# Patient Record
Sex: Male | Born: 1985 | Race: White | Hispanic: No | Marital: Married | Smoking: Never smoker
Health system: Southern US, Community
[De-identification: ages and names within clinical notes are randomized; demographics above are authoritative.]

## PROBLEM LIST (undated history)

## (undated) HISTORY — PX: EYE MUSCLE SURGERY: SHX370

---

## 2005-01-03 ENCOUNTER — Emergency Department: Payer: Self-pay | Admitting: Emergency Medicine

## 2005-01-11 ENCOUNTER — Emergency Department: Payer: Self-pay | Admitting: Emergency Medicine

## 2008-06-16 ENCOUNTER — Ambulatory Visit: Payer: Self-pay | Admitting: Internal Medicine

## 2008-08-29 ENCOUNTER — Emergency Department: Payer: Self-pay | Admitting: Emergency Medicine

## 2012-09-06 ENCOUNTER — Emergency Department: Payer: Self-pay | Admitting: Emergency Medicine

## 2013-11-28 ENCOUNTER — Ambulatory Visit (INDEPENDENT_AMBULATORY_CARE_PROVIDER_SITE_OTHER): Payer: BC Managed Care – PPO

## 2013-11-28 ENCOUNTER — Ambulatory Visit (INDEPENDENT_AMBULATORY_CARE_PROVIDER_SITE_OTHER): Payer: BC Managed Care – PPO | Admitting: Podiatry

## 2013-11-28 ENCOUNTER — Encounter: Payer: Self-pay | Admitting: Podiatry

## 2013-11-28 VITALS — BP 116/80 | HR 77 | Resp 16 | Ht 71.0 in | Wt 155.0 lb

## 2013-11-28 DIAGNOSIS — H547 Unspecified visual loss: Secondary | ICD-10-CM | POA: Insufficient documentation

## 2013-11-28 DIAGNOSIS — M722 Plantar fascial fibromatosis: Secondary | ICD-10-CM

## 2013-11-28 DIAGNOSIS — J309 Allergic rhinitis, unspecified: Secondary | ICD-10-CM | POA: Insufficient documentation

## 2013-11-28 DIAGNOSIS — M7660 Achilles tendinitis, unspecified leg: Secondary | ICD-10-CM

## 2013-11-28 NOTE — Patient Instructions (Signed)

## 2013-11-28 NOTE — Progress Notes (Signed)
   Subjective:    Patient ID: Michael BeaverMatthew Nixon, male    DOB: [birth year], 28 y.o.   MRN: [id number]  HPI Comments: Bilateral heel pain for about 6 months now . Sharp pain in the back and bottom of the heels.  i am a runner in which it has stopped me from doing   Foot Pain      Review of Systems  All other systems reviewed and are negative.      Objective:   Physical Exam        Assessment & Plan:

## 2013-11-29 NOTE — Progress Notes (Signed)
Subjective:     Patient ID: Michael BeaverMatthew Nixon, male   DOB: 14-Mar-1985, 28 y.o.   MRN: [id number]  Foot Pain   patient presents stating I have had around 6 months of heel pain that is really bad when I try to run which I do love to do. I believe the pain is more in the back than it is on the bottom   Review of Systems  All other systems reviewed and are negative.      Objective:   Physical Exam  Nursing note and vitals reviewed. Constitutional: He is oriented to person, place, and time.  Cardiovascular: Intact distal pulses.   Musculoskeletal: Normal range of motion.  Neurological: He is oriented to person, place, and time.  Skin: Skin is warm.   neurovascular status intact with muscle strength adequate and range of motion subtalar midtarsal joint within normal limits. Patient is noted to have moderate discomfort in the posterior lateral aspect of the heel at the insertion of the Achilles tendon with mild redness and inflammation noted. Mild plantar pain but not as significant     Assessment:     Probable Achilles tendinitis with tight heel cord noted and mild plantar fasciitis    Plan:     H&P and x-rays reviewed. 8 patient instructions on anti-inflammatories physical therapy ice therapy and scanned for custom orthotics to try to lift the posterior foot but explained that this may not be everything we will need to do to get this better. Patient will be seen back when orthotics are ready

## 2013-12-06 ENCOUNTER — Telehealth: Payer: Self-pay

## 2013-12-06 NOTE — Telephone Encounter (Signed)
Left voicemail for patient to schedule appointment for orthotic pickup

## 2013-12-06 NOTE — Telephone Encounter (Signed)
Left voicemail for patient to schedule appointment for orthotic pickup 

## 2013-12-15 ENCOUNTER — Ambulatory Visit (INDEPENDENT_AMBULATORY_CARE_PROVIDER_SITE_OTHER): Payer: BC Managed Care – PPO | Admitting: *Deleted

## 2013-12-15 VITALS — BP 116/80 | HR 77 | Resp 16

## 2013-12-15 DIAGNOSIS — M722 Plantar fascial fibromatosis: Secondary | ICD-10-CM

## 2013-12-15 NOTE — Progress Notes (Signed)
Pt presents to pick up orthotics. Went over wearing instructions with pt. 

## 2013-12-15 NOTE — Patient Instructions (Signed)

## 2014-01-12 ENCOUNTER — Ambulatory Visit: Payer: BC Managed Care – PPO | Admitting: Podiatry

## 2014-01-19 ENCOUNTER — Ambulatory Visit: Payer: BC Managed Care – PPO | Admitting: Podiatry

## 2014-01-23 DIAGNOSIS — M722 Plantar fascial fibromatosis: Secondary | ICD-10-CM

## 2017-03-14 DIAGNOSIS — M791 Myalgia, unspecified site: Secondary | ICD-10-CM | POA: Diagnosis not present

## 2017-03-14 DIAGNOSIS — J111 Influenza due to unidentified influenza virus with other respiratory manifestations: Secondary | ICD-10-CM | POA: Diagnosis not present

## 2017-03-14 DIAGNOSIS — J029 Acute pharyngitis, unspecified: Secondary | ICD-10-CM | POA: Diagnosis not present

## 2017-09-18 ENCOUNTER — Encounter: Payer: Self-pay | Admitting: Emergency Medicine

## 2017-09-18 ENCOUNTER — Other Ambulatory Visit: Payer: Self-pay

## 2017-09-18 ENCOUNTER — Emergency Department
Admission: EM | Admit: 2017-09-18 | Discharge: 2017-09-18 | Disposition: A | Discharge status: Home / Self Care | Payer: 59 | Attending: Emergency Medicine | Admitting: Emergency Medicine

## 2017-09-18 ENCOUNTER — Emergency Department: Payer: 59

## 2017-09-18 DIAGNOSIS — R079 Chest pain, unspecified: Secondary | ICD-10-CM | POA: Diagnosis not present

## 2017-09-18 DIAGNOSIS — R911 Solitary pulmonary nodule: Secondary | ICD-10-CM | POA: Insufficient documentation

## 2017-09-18 DIAGNOSIS — Z79899 Other long term (current) drug therapy: Secondary | ICD-10-CM | POA: Diagnosis not present

## 2017-09-18 DIAGNOSIS — R0789 Other chest pain: Secondary | ICD-10-CM | POA: Diagnosis not present

## 2017-09-18 LAB — CBC
HEMATOCRIT: 43.5 % (ref 40.0–52.0)
Hemoglobin: 15.5 g/dL (ref 13.0–18.0)
MCH: 32.7 pg (ref 26.0–34.0)
MCHC: 35.6 g/dL (ref 32.0–36.0)
MCV: 91.8 fL (ref 80.0–100.0)
PLATELETS: 186 10*3/uL (ref 150–440)
RBC: 4.74 MIL/uL (ref 4.40–5.90)
RDW: 12.1 % (ref 11.5–14.5)
WBC: 5.5 10*3/uL (ref 3.8–10.6)

## 2017-09-18 LAB — COMPREHENSIVE METABOLIC PANEL
ALT: 23 U/L (ref 0–44)
AST: 24 U/L (ref 15–41)
Albumin: 4.3 g/dL (ref 3.5–5.0)
Alkaline Phosphatase: 64 U/L (ref 38–126)
Anion gap: 8 (ref 5–15)
BUN: 15 mg/dL (ref 6–20)
CHLORIDE: 104 mmol/L (ref 98–111)
CO2: 27 mmol/L (ref 22–32)
CREATININE: 1 mg/dL (ref 0.61–1.24)
Calcium: 9.1 mg/dL (ref 8.9–10.3)
Glucose, Bld: 160 mg/dL — ABNORMAL HIGH (ref 70–99)
POTASSIUM: 3.9 mmol/L (ref 3.5–5.1)
SODIUM: 139 mmol/L (ref 135–145)
Total Bilirubin: 1 mg/dL (ref 0.3–1.2)
Total Protein: 7.1 g/dL (ref 6.5–8.1)

## 2017-09-18 LAB — TROPONIN I: Troponin I: <0.03 ng/mL (ref <0.03)

## 2017-09-18 MED ORDER — PANTOPRAZOLE SODIUM 20 MG PO TBEC: 20.0000 mg | DELAYED_RELEASE_TABLET | Freq: Every day | ORAL | 1 refills | Status: DC

## 2017-09-18 NOTE — ED Notes (Signed)
Pt reports tightness in chest that radiates into left breast - the pain in breast is a sharp pain - the pain started one month ago and has increased in intensity since then - the pain only last for "seconds at a time" - denies shortness of breath - denies N/V

## 2017-09-18 NOTE — ED Triage Notes (Signed)
Intermittent L chest pain x 4 weeks. Denies injury. Occasional cough. Does not increase with palpitation or inspiration.

## 2017-09-18 NOTE — Discharge Instructions (Addendum)
You were seen for chest pain. Your workup today was reassuring. As I explained to you that does not mean that you do not have heart disease. You may need further evaluation to ensure you do not have a serious heart problem. Therefore it is imperative that you follow up with your doctor in 1-2 days for further evaluation.   Your chest Xray showed a pulmonary nodule. You need a repeat XR in 6 months to make sure this is not changing as enlarging nodules could be cancer.   When should you call for help?  Call 911 if: You passed out (lost consciousness) or if you feel dizzy. You have difficulty breathing. You have symptoms of a heart attack. These may include: Chest pain or pressure, or a strange feeling in your chest. Indigestion. Sweating. Shortness of breath. Nausea or vomiting. Pain, pressure, or a strange feeling in your back, neck, jaw, or upper belly or in one or both shoulders or arms. Lightheadedness or sudden weakness. A fast or irregular heartbeat. After you call 911, the operator may tell you to chew 1 adult-strength or 2 to 4 low-dose aspirin. Wait for an ambulance. Do not try to drive yourself.   Call your doctor today if: You have any trouble breathing. Your chest pain gets worse. You are dizzy or lightheaded, or you feel like you may faint. You are not getting better as expected. You are having new or different chest pain  How can you care for yourself at home? Rest until you feel better. Take your medicine exactly as prescribed. Call your doctor if you think you are having a problem with your medicine. Do not drive after taking a prescription pain medicine.

## 2017-09-18 NOTE — ED Provider Notes (Signed)
[facility] Emergency Department Provider Note  ____________________________________________  Time seen: Approximately 3:37 PM  I have reviewed the triage vital signs and the nursing notes.   HISTORY  Chief Complaint Chest Pain   HPI Michael Nixon is a 32 y.o. male with history of allergies who presents for evaluation of chest pain.  Patient reports that he has been having intermittent chest pain for a month.  He describes the pain as a tightness located diffusely across his chest with intermittent sharp component in the left upper chest and left upper quadrant.  These episodes happen several times a day and usually lasts a second or 2.  Patient reports that when he takes a deep breath the pain goes away.  No shortness of breath, no dizziness, no nausea, no vomiting, no diaphoresis.  Patient denies any history of smoking, no drug use.  He does drink every day 2-3 drinks.  He has family history of heart attack in his grandfather at the age of 67.  Patient reports that over the last week he has been having several episodes a day.  These episodes happen both at rest and with exertion.  They are not worse postprandially.  No personal or family history of blood clots, recent travel immobilization, leg pain or swelling, hemoptysis, or exogenous hormones.   Patient Active Problem List   Diagnosis Date Noted  . Allergic rhinitis 11/28/2013  . Impaired vision 11/28/2013    Past Surgical History:  Procedure Laterality Date  . EYE MUSCLE SURGERY      Prior to Admission medications   Medication Sig Start Date End Date Taking? Authorizing Provider  cetirizine (ZYRTEC) 10 MG tablet Take 10 mg by mouth daily as needed for allergies.   Yes [provider]  pantoprazole (PROTONIX) 20 MG tablet Take 1 tablet (20 mg total) by mouth daily. 09/18/17 09/18/18  Nita Sickle, MD    Allergies Aspirin  No family history on file.  Social History Social History     Tobacco Use  . Smoking status: Never Smoker  . Smokeless tobacco: Never Used  Substance Use Topics  . Alcohol use: Not on file  . Drug use: Not on file    Review of Systems  Constitutional: Negative for fever. Eyes: Negative for visual changes. ENT: Negative for sore throat. Neck: No neck pain  Cardiovascular: + chest pain. Respiratory: Negative for shortness of breath. Gastrointestinal: Negative for abdominal pain, vomiting or diarrhea. Genitourinary: Negative for dysuria. Musculoskeletal: Negative for back pain. Skin: Negative for rash. Neurological: Negative for headaches, weakness or numbness. Psych: No SI or HI  ____________________________________________   PHYSICAL EXAM:  VITAL SIGNS: ED Triage Vitals  Enc Vitals Group     BP 09/18/17 1233 (!) 142/76     Pulse Rate 09/18/17 1233 80     Resp 09/18/17 1233 18     Temp 09/18/17 1233 98.6 F (37 C)     Temp Source 09/18/17 1233 Oral     SpO2 09/18/17 1233 97 %     Weight 09/18/17 1234 170 lb (77.1 kg)     Height 09/18/17 1234 5\' 10"  (1.778 m)     Head Circumference --      Peak Flow --      Pain Score 09/18/17 1234 2     Pain Loc --      Pain Edu? --      Excl. in GC? --     Constitutional: Alert and oriented. Well appearing and in  no apparent distress. HEENT:      Head: Normocephalic and atraumatic.         Eyes: Conjunctivae are normal. Sclera is non-icteric.       Mouth/Throat: Mucous membranes are moist.       Neck: Supple with no signs of meningismus. Cardiovascular: Regular rate and rhythm. No murmurs, gallops, or rubs. 2+ symmetrical distal pulses are present in all extremities. No JVD. Respiratory: Normal respiratory effort. Lungs are clear to auscultation bilaterally. No wheezes, crackles, or rhonchi.  Gastrointestinal: Soft, non tender, and non distended with positive bowel sounds. No rebound or guarding. Musculoskeletal: Nontender with normal range of motion in all extremities. No edema,  cyanosis, or erythema of extremities. Neurologic: Normal speech and language. Face is symmetric. Moving all extremities. No gross focal neurologic deficits are appreciated. Skin: Skin is warm, dry and intact. No rash noted. Psychiatric: Mood and affect are normal. Speech and behavior are normal.  ____________________________________________   LABS (all labs ordered are listed, but only abnormal results are displayed)  Labs Reviewed  COMPREHENSIVE METABOLIC PANEL - Abnormal; Notable for the following components:      Result Value   Glucose, Bld 160 (*)    All other components within normal limits  CBC  TROPONIN I  TROPONIN I   ____________________________________________  EKG  ED ECG REPORT I, Nita Sicklearolina Amenah Tucci, the attending physician, personally viewed and interpreted this ECG.  Normal sinus rhythm, rate of 83, normal intervals, normal axis, no ST elevations or depressions.  No prior for comparison. ____________________________________________  RADIOLOGY  I have personally reviewed the images performed during this visit and I agree with the Radiologist's read.   Interpretation by Radiologist:  Dg Chest 2 View  Result Date: 09/18/2017 CLINICAL DATA:  Intermittent left-sided chest pain for 1 month. Constant chest pressure. EXAM: CHEST - 2 VIEW COMPARISON:  None. FINDINGS: A 3 mm nodular density projects in the right upper lobe. This is not well seen on the lateral view. This likely represents a small granuloma. Neoplasm would be unlikely in this age group. Heart size is normal. The lungs are otherwise clear. No significant edema or effusion is present. No airspace disease is present. The visualized soft tissues and bony thorax are otherwise unremarkable. IMPRESSION: 1. No acute cardiopulmonary disease. 2. 3 mm nodular density in the right upper lobe is likely post infectious or inflammation. Recommend follow-up two-view chest x-ray at 6-9 months versus CT of the chest with  contrast for further evaluation. Electronically Signed   By: Marin Robertshristopher  Mattern M.D.   On: 09/18/2017 13:03     ____________________________________________   PROCEDURES  Procedure(s) performed: None Procedures Critical Care performed:  None ____________________________________________   INITIAL IMPRESSION / ASSESSMENT AND PLAN / ED COURSE  32 y.o. male with history of allergies who presents for evaluation of chest pain.   Chest pain in a 32 y.o. male with low suspicion for cardiac (HEART score 1) or other serious etiology (including aortic dissection, pneumonia, pneumothorax, or pulmonary embolism) based his history and physical exam in the ED today. EKG normal. Plan for labs including CBC, chemistries and troponin now and in 3 hours, CXR and re-evaluation for disposition. Will observe patient on cardiac monitor while in the ED.      _________________________ 4:32 PM on 09/18/2017 -----------------------------------------  Second troponin is negative.  Chest x-ray showing pulmonary nodule.  Discussed this finding with the patient and recommended repeat imaging in 6 months to rule out malignancy.  At this time with normal  labs, normal EKG, and very atypical history I believe patient is safe for discharge home with close follow-up with primary care doctor.  Discussed return precautions for new or worsening chest pain, dizziness, shortness of breath, or syncope.   As part of my medical decision making, I reviewed the following data within the electronic MEDICAL RECORD NUMBER Nursing notes reviewed and incorporated, Labs reviewed , EKG interpreted , Old chart reviewed, Radiograph reviewed , Notes from prior ED visits and [place] Controlled Substance Database    Pertinent labs & imaging results that were available during my care of the patient were reviewed by me and considered in my medical decision making (see chart for details).    ____________________________________________   FINAL  CLINICAL IMPRESSION(S) / ED DIAGNOSES  Final diagnoses:  Chest pain, unspecified type  Pulmonary nodule      NEW MEDICATIONS STARTED DURING THIS VISIT:  ED Discharge Orders         Ordered    pantoprazole (PROTONIX) 20 MG tablet  Daily     09/18/17 1630           Note:  This document was prepared using Dragon voice recognition software and may include unintentional dictation errors.    Nita Sickle, MD 09/18/17 [phone]

## 2017-09-18 NOTE — ED Notes (Signed)
Dr. Veronese in room to assess patient.  Will continue to monitor.  

## 2017-09-24 DIAGNOSIS — H43812 Vitreous degeneration, left eye: Secondary | ICD-10-CM | POA: Diagnosis not present

## 2017-09-24 DIAGNOSIS — Z961 Presence of intraocular lens: Secondary | ICD-10-CM | POA: Diagnosis not present

## 2017-09-24 DIAGNOSIS — H33051 Total retinal detachment, right eye: Secondary | ICD-10-CM | POA: Diagnosis not present

## 2018-06-23 DIAGNOSIS — Z20828 Contact with and (suspected) exposure to other viral communicable diseases: Secondary | ICD-10-CM | POA: Diagnosis not present

## 2018-06-23 DIAGNOSIS — Z03818 Encounter for observation for suspected exposure to other biological agents ruled out: Secondary | ICD-10-CM | POA: Diagnosis not present

## 2018-07-25 DIAGNOSIS — Z3009 Encounter for other general counseling and advice on contraception: Secondary | ICD-10-CM | POA: Diagnosis not present

## 2018-07-25 DIAGNOSIS — H43812 Vitreous degeneration, left eye: Secondary | ICD-10-CM | POA: Diagnosis not present

## 2018-07-25 DIAGNOSIS — H33051 Total retinal detachment, right eye: Secondary | ICD-10-CM | POA: Diagnosis not present

## 2018-08-03 DIAGNOSIS — Z302 Encounter for sterilization: Secondary | ICD-10-CM | POA: Diagnosis not present

## 2018-08-10 ENCOUNTER — Encounter: Payer: Self-pay | Admitting: Emergency Medicine

## 2018-08-10 ENCOUNTER — Other Ambulatory Visit: Payer: Self-pay

## 2018-08-10 DIAGNOSIS — Z20828 Contact with and (suspected) exposure to other viral communicable diseases: Secondary | ICD-10-CM | POA: Insufficient documentation

## 2018-08-10 DIAGNOSIS — R45851 Suicidal ideations: Secondary | ICD-10-CM | POA: Insufficient documentation

## 2018-08-10 DIAGNOSIS — F99 Mental disorder, not otherwise specified: Secondary | ICD-10-CM | POA: Diagnosis present

## 2018-08-10 DIAGNOSIS — Z79899 Other long term (current) drug therapy: Secondary | ICD-10-CM | POA: Insufficient documentation

## 2018-08-10 DIAGNOSIS — F1020 Alcohol dependence, uncomplicated: Secondary | ICD-10-CM | POA: Diagnosis not present

## 2018-08-10 DIAGNOSIS — R69 Illness, unspecified: Secondary | ICD-10-CM | POA: Diagnosis not present

## 2018-08-10 DIAGNOSIS — Z03818 Encounter for observation for suspected exposure to other biological agents ruled out: Secondary | ICD-10-CM | POA: Diagnosis not present

## 2018-08-10 LAB — COMPREHENSIVE METABOLIC PANEL WITH GFR
ALT: 31 U/L (ref 0–44)
AST: 26 U/L (ref 15–41)
Albumin: 4.6 g/dL (ref 3.5–5.0)
Alkaline Phosphatase: 67 U/L (ref 38–126)
Anion gap: 10 (ref 5–15)
BUN: 23 mg/dL — ABNORMAL HIGH (ref 6–20)
CO2: 22 mmol/L (ref 22–32)
Calcium: 8.8 mg/dL — ABNORMAL LOW (ref 8.9–10.3)
Chloride: 107 mmol/L (ref 98–111)
Creatinine, Ser: 1.04 mg/dL (ref 0.61–1.24)
GFR calc Af Amer: >60 mL/min
GFR calc non Af Amer: >60 mL/min
Glucose, Bld: 111 mg/dL — ABNORMAL HIGH (ref 70–99)
Potassium: 3.6 mmol/L (ref 3.5–5.1)
Sodium: 139 mmol/L (ref 135–145)
Total Bilirubin: 0.6 mg/dL (ref 0.3–1.2)
Total Protein: 8.2 g/dL — ABNORMAL HIGH (ref 6.5–8.1)

## 2018-08-10 LAB — CBC
HCT: 44.9 % (ref 39.0–52.0)
Hemoglobin: 16.1 g/dL (ref 13.0–17.0)
MCH: 31.6 pg (ref 26.0–34.0)
MCHC: 35.9 g/dL (ref 30.0–36.0)
MCV: 88.2 fL (ref 80.0–100.0)
Platelets: 228 10*3/uL (ref 150–400)
RBC: 5.09 MIL/uL (ref 4.22–5.81)
RDW: 11.3 % — ABNORMAL LOW (ref 11.5–15.5)
WBC: 6.5 10*3/uL (ref 4.0–10.5)
nRBC: 0 % (ref 0.0–0.2)

## 2018-08-10 LAB — URINE DRUG SCREEN, QUALITATIVE (ARMC ONLY)
Amphetamines, Ur Screen: NOT DETECTED
Barbiturates, Ur Screen: NOT DETECTED
Benzodiazepine, Ur Scrn: NOT DETECTED
Cannabinoid 50 Ng, Ur ~~LOC~~: NOT DETECTED
Cocaine Metabolite,Ur ~~LOC~~: NOT DETECTED
MDMA (Ecstasy)Ur Screen: NOT DETECTED
Methadone Scn, Ur: NOT DETECTED
Opiate, Ur Screen: NOT DETECTED
Phencyclidine (PCP) Ur S: NOT DETECTED
Tricyclic, Ur Screen: NOT DETECTED

## 2018-08-10 LAB — ETHANOL: Alcohol, Ethyl (B): 232 mg/dL — ABNORMAL HIGH (ref <10)

## 2018-08-10 LAB — ACETAMINOPHEN LEVEL: Acetaminophen (Tylenol), Serum: <10 ug/mL — ABNORMAL LOW (ref 10–30)

## 2018-08-10 LAB — SALICYLATE LEVEL: Salicylate Lvl: <7 mg/dL (ref 2.8–30.0)

## 2018-08-10 NOTE — ED Triage Notes (Addendum)
Patient coming in voluntarily with [facility] because wife called and said that they had an argument and "stated he wanted to end it all and that he was going to get into the gun safe" Patient has a vasectomy 1 week ago and states he is still sore and that much of the arguing and fighting has been because of the vasectomy. Patient completely denies HI and SI and states that he and his wife were just fighting.

## 2018-08-11 ENCOUNTER — Emergency Department
Admission: EM | Admit: 2018-08-11 | Discharge: 2018-08-11 | Disposition: A | Discharge status: Other Acute Inpatient Hospital | Payer: 59 | Attending: Emergency Medicine | Admitting: Emergency Medicine

## 2018-08-11 ENCOUNTER — Inpatient Hospital Stay (HOSPITAL_COMMUNITY)
Admission: AD | Admit: 2018-08-11 | Discharge: 2018-08-15 | DRG: 885 | Disposition: A | Discharge status: Home / Self Care | Payer: 59 | Source: Intra-hospital | Attending: Psychiatry | Admitting: Psychiatry

## 2018-08-11 ENCOUNTER — Encounter (HOSPITAL_COMMUNITY): Payer: Self-pay

## 2018-08-11 DIAGNOSIS — F10920 Alcohol use, unspecified with intoxication, uncomplicated: Secondary | ICD-10-CM

## 2018-08-11 DIAGNOSIS — Z79899 Other long term (current) drug therapy: Secondary | ICD-10-CM | POA: Diagnosis not present

## 2018-08-11 DIAGNOSIS — Y907 Blood alcohol level of 200-239 mg/100 ml: Secondary | ICD-10-CM | POA: Diagnosis present

## 2018-08-11 DIAGNOSIS — R45850 Homicidal ideations: Secondary | ICD-10-CM | POA: Diagnosis not present

## 2018-08-11 DIAGNOSIS — F332 Major depressive disorder, recurrent severe without psychotic features: Principal | ICD-10-CM | POA: Diagnosis present

## 2018-08-11 DIAGNOSIS — F1024 Alcohol dependence with alcohol-induced mood disorder: Secondary | ICD-10-CM

## 2018-08-11 DIAGNOSIS — F1094 Alcohol use, unspecified with alcohol-induced mood disorder: Secondary | ICD-10-CM

## 2018-08-11 DIAGNOSIS — R45851 Suicidal ideations: Secondary | ICD-10-CM | POA: Diagnosis present

## 2018-08-11 DIAGNOSIS — Z20828 Contact with and (suspected) exposure to other viral communicable diseases: Secondary | ICD-10-CM | POA: Diagnosis not present

## 2018-08-11 DIAGNOSIS — R69 Illness, unspecified: Secondary | ICD-10-CM | POA: Diagnosis not present

## 2018-08-11 DIAGNOSIS — F1020 Alcohol dependence, uncomplicated: Secondary | ICD-10-CM | POA: Diagnosis not present

## 2018-08-11 LAB — SARS CORONAVIRUS 2 BY RT PCR (HOSPITAL ORDER, PERFORMED IN ~~LOC~~ HOSPITAL LAB): SARS Coronavirus 2: NEGATIVE

## 2018-08-11 MED ORDER — HYDROXYZINE HCL 25 MG PO TABS: 25.0000 mg | ORAL_TABLET | Freq: Three times a day (TID) | ORAL | Status: DC | PRN

## 2018-08-11 MED ORDER — CHLORDIAZEPOXIDE HCL 25 MG PO CAPS: 25.0000 mg | ORAL_CAPSULE | Freq: Every day | ORAL | Status: DC

## 2018-08-11 MED ORDER — THIAMINE HCL 100 MG/ML IJ SOLN: 100.0000 mg | Freq: Every day | INTRAMUSCULAR | Status: DC

## 2018-08-11 MED ORDER — VITAMIN B-1 100 MG PO TABS
100.0000 mg | ORAL_TABLET | Freq: Every day | ORAL | Status: DC
  Administered 2018-08-12 – 2018-08-15 (×4): 100 mg via ORAL
  Filled 2018-08-11 (×6): qty 1

## 2018-08-11 MED ORDER — CHLORDIAZEPOXIDE HCL 25 MG PO CAPS: 25.0000 mg | ORAL_CAPSULE | Freq: Four times a day (QID) | ORAL | Status: AC | PRN

## 2018-08-11 MED ORDER — TRAZODONE HCL 50 MG PO TABS: 50.0000 mg | ORAL_TABLET | Freq: Every evening | ORAL | Status: DC | PRN

## 2018-08-11 MED ORDER — LORAZEPAM 2 MG PO TABS: 0.0000 mg | ORAL_TABLET | Freq: Four times a day (QID) | ORAL | Status: DC

## 2018-08-11 MED ORDER — ALUM & MAG HYDROXIDE-SIMETH 200-200-20 MG/5ML PO SUSP: 30.0000 mL | ORAL | Status: DC | PRN

## 2018-08-11 MED ORDER — ACETAMINOPHEN 325 MG PO TABS: 650.0000 mg | ORAL_TABLET | Freq: Four times a day (QID) | ORAL | Status: DC | PRN

## 2018-08-11 MED ORDER — CHLORDIAZEPOXIDE HCL 25 MG PO CAPS: 25.0000 mg | ORAL_CAPSULE | Freq: Three times a day (TID) | ORAL | Status: DC

## 2018-08-11 MED ORDER — VITAMIN B-1 100 MG PO TABS: 100.0000 mg | ORAL_TABLET | Freq: Every day | ORAL | Status: DC

## 2018-08-11 MED ORDER — LORAZEPAM 2 MG/ML IJ SOLN: 0.0000 mg | Freq: Four times a day (QID) | INTRAMUSCULAR | Status: DC

## 2018-08-11 MED ORDER — CHLORDIAZEPOXIDE HCL 25 MG PO CAPS
25.0000 mg | ORAL_CAPSULE | Freq: Four times a day (QID) | ORAL | Status: DC
  Administered 2018-08-12: 25 mg via ORAL
  Filled 2018-08-11 (×2): qty 1

## 2018-08-11 MED ORDER — LORAZEPAM 2 MG/ML IJ SOLN: 0.0000 mg | Freq: Two times a day (BID) | INTRAMUSCULAR | Status: DC

## 2018-08-11 MED ORDER — ADULT MULTIVITAMIN W/MINERALS CH
1.0000 | ORAL_TABLET | Freq: Every day | ORAL | Status: DC
  Administered 2018-08-12 – 2018-08-15 (×4): 1 via ORAL
  Filled 2018-08-11 (×6): qty 1

## 2018-08-11 MED ORDER — MAGNESIUM HYDROXIDE 400 MG/5ML PO SUSP: 30.0000 mL | Freq: Every day | ORAL | Status: DC | PRN

## 2018-08-11 MED ORDER — LOPERAMIDE HCL 2 MG PO CAPS: 2.0000 mg | ORAL_CAPSULE | ORAL | Status: AC | PRN

## 2018-08-11 MED ORDER — ONDANSETRON 4 MG PO TBDP: 4.0000 mg | ORAL_TABLET | Freq: Four times a day (QID) | ORAL | Status: AC | PRN

## 2018-08-11 MED ORDER — CHLORDIAZEPOXIDE HCL 25 MG PO CAPS: 25.0000 mg | ORAL_CAPSULE | ORAL | Status: DC

## 2018-08-11 MED ORDER — LORAZEPAM 2 MG PO TABS: 0.0000 mg | ORAL_TABLET | Freq: Two times a day (BID) | ORAL | Status: DC

## 2018-08-11 NOTE — ED Provider Notes (Signed)
Vitals:   08/11/18 1700 08/11/18 1911  BP: (!) 159/90 (!) 167/77  Pulse: 93 (!) 101  Resp:  16  Temp:  99.1 F (37.3 C)  SpO2: (!) 87% 96%    Patient awake and alert.  Comfortable with plan for transfer.  Understanding and agreement with transfer plan.  He appears stable in no distress   Delman Kitten, MD 08/11/18 2020

## 2018-08-11 NOTE — BH Assessment (Signed)
Patient has been accepted to [facility].  Patient assigned to room 302 Accepting physician is Dr. Mallie Darting.  Call report to [phone].  Representative was [employer].   ER Staff is aware of it:  Solicitor, Patient's Nurse      Address: [address] [place], [place] [address]

## 2018-08-11 NOTE — ED Notes (Addendum)
Liston Alba, pt's wife, would like a phone call later this morning/today when pt's disposition has been determined (she wants to know if he's being hospitalized for psych treatment or being d/c'd). She would like to be informed d/t safety concerns for herself and children.  [phone]

## 2018-08-11 NOTE — Progress Notes (Signed)
Psychoeducational Group Note  Date:  08/11/2018 Time:  2113  Group Topic/Focus:  Wrap-Up Group:   The focus of this group is to help patients review their daily goal of treatment and discuss progress on daily workbooks.  Participation Level: Did Not Attend  Participation Quality:  Not Applicable  Affect:  Not Applicable  Cognitive:  Not Applicable  Insight:  Not Applicable  Engagement in Group: Not Applicable  Additional Comments:  The patient did not attend group this evening.   Archie Balboa S 08/11/2018, 9:13 PM

## 2018-08-11 NOTE — ED Notes (Signed)
Pt provided with plan of care update.

## 2018-08-11 NOTE — BH Assessment (Signed)
Assessment Note  Michael Nixon is a 33 y.o. male who was brought to [facility] voluntarily by police due to getting into a verbal confrontation with his wife and, according to her, raging about the house, going to his gun safe, and stating over and over that, "I'm gonna die." Pt's wife stated that pt's gun safe is in a closet inside of the garage and that each of their rooms and doors has a sensor that tells you when someone opens a door and that she heard the alarm go off when her husband opened the closet his gun safe is in, which is why she believes he went into his gun safe. Pt's wife did not note seeing her husband holding a gun at any time during this altercation, but she called the police due to concerns that her husband would harm himself. Pt's wife stated that pt told her that he wanted to blow his brains out. Pt's wife stats pt has had a "hard recovery with his vasectomy" and that "he drinks a lot," adding that pt has been drinking a high-gravity IPA that has around 9% alcohol and that he drinks around 5 of those in a 2-hour window. Pt's wife stated, "he did not hit me" but that she was scared at the possibility of pt hurting himself.  Pt denies SI, stating that he has never had any thoughts or intentions of harming himself. He states he has never attempted to harm himself nor has he ever been hospitalized for mental health reasons or seen a therapist or a psychiatrist. Pt denies HI, AVH, NSSIB, engagement with the legal system, or SA (with the exception of EtOH). Pt states he drinks approximately 6 days per week and that on weekdays he consumes "4-ish" drinks and that on weekends he consumes 5-6 drinks. Pt acknowledges he has guns but states he keeps them in a gun safe to keep them safe from the children.  Pt gave verbal consent for clinician to contact his wife for collateral information.  Pt is oriented x4. His recent and remote memory is intact. Pt was cooperative throughout the assessment process.  Pt's insight, judgement, and impulse control is impaired at this time.    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 No pertinent past medical history.  Past Surgical History:  Procedure Laterality Date  . EYE MUSCLE SURGERY      Family History: No family history on file.  Social History:  reports that he has never smoked. He has never used smokeless tobacco. No history on file for alcohol and drug.  Additional Social History:  Alcohol / Drug Use Pain Medications: Please see MAR Prescriptions: Please see MAR Over the Counter: Please see MAR History of alcohol / drug use?: Yes Longest period of sobriety (when/how long): Unknown Substance #1 Name of Substance 1: EtOH 1 - Age of First Use: Unknown 1 - Amount (size/oz): KCLEXNTZ = 4-ish beverages; Weekends = 5/6 beverages 1 - Frequency: 6 days/week 1 - Duration: Unknown 1 - Last Use / Amount: 08/10/2018  CIWA: CIWA-Ar BP: (!) 148/92 Pulse Rate: (!) 106 COWS:    Allergies:  Allergies  Allergen Reactions  . Aspirin Hives  . Bee Venom     Home Medications: (Not in a hospital admission)   OB/GYN Status:  No LMP for male patient.  General Assessment Data Location of Assessment: [facility] ED TTS Assessment: In system Is this a Tele or Face-to-Face Assessment?: Face-to-Face Is this an Initial Assessment  or a Re-assessment for this encounter?: Initial Assessment Patient Accompanied by:: N/A Language Other than English: No Living Arrangements: Other (Comment)(Pt lives with his wife and their two children) What gender do you identify as?: Male Marital status: Married ArtistMaiden name: Kosch Pregnancy Status: No Living Arrangements: Spouse/significant other, Children Can pt return to current living arrangement?: (UTA) Admission Status: Voluntary Is patient capable of signing voluntary admission?: Yes Referral Source: MD Insurance type: AETNA     Crisis Care Plan Living  Arrangements: Spouse/significant other, Children Legal Guardian: Other:(Self) Name of Psychiatrist: None Name of Therapist: None  Education Status Is patient currently in school?: No Is the patient employed, unemployed or receiving disability?: Employed  Risk to self with the past 6 months Suicidal Ideation: (Pt denies, though his wife states he made multiple threats) Has patient been a risk to self within the past 6 months prior to admission? : No Suicidal Intent: (Pt denies, though his wife states he made multiple threats) Has patient had any suicidal intent within the past 6 months prior to admission? : No Is patient at risk for suicide?: (Pt denies, though his wife states he made multiple threats) Suicidal Plan?: (Pt's wife states pt went to his gun safe & made threats) Has patient had any suicidal plan within the past 6 months prior to admission? : No Access to Means: Yes Specify Access to Suicidal Means: Pt has a gun safe with guns in it What has been your use of drugs/alcohol within the last 12 months?: Pt acknowledges almost daily EtOH use Previous Attempts/Gestures: No How many times?: 0 Other Self Harm Risks: None noted Triggers for Past Attempts: None known Intentional Self Injurious Behavior: None Family Suicide History: Unable to assess Recent stressful life event(s): Conflict (Comment)(Conflict between pt and his wife, wife possibly cheating) Persecutory voices/beliefs?: No Depression: Yes Depression Symptoms: Guilt, Feeling worthless/self pity, Feeling angry/irritable Substance abuse history and/or treatment for substance abuse?: No  Risk to Others within the past 6 months Homicidal Ideation: No Does patient have any lifetime risk of violence toward others beyond the six months prior to admission? : No Thoughts of Harm to Others: No Current Homicidal Intent: No Current Homicidal Plan: No Access to Homicidal Means: No Identified Victim: None noted History of harm  to others?: No Assessment of Violence: On admission Violent Behavior Description: None noted Does patient have access to weapons?: Yes (Comment)(Pt has a gun safe with guns in it) Criminal Charges Pending?: No Does patient have a court date: No Is patient on probation?: No  Psychosis Hallucinations: None noted Delusions: None noted  Mental Status Report Appearance/Hygiene: In scrubs Eye Contact: Good Motor Activity: Unremarkable, Other (Comment)(Pt is sitting up in his hospital bed) Speech: Logical/coherent Level of Consciousness: Alert Mood: Pleasant, Sullen Affect: Appropriate to circumstance Anxiety Level: Moderate Thought Processes: Coherent, Relevant Judgement: Partial Orientation: Person, Place, Time, Situation Obsessive Compulsive Thoughts/Behaviors: None  Cognitive Functioning Concentration: Normal Memory: Recent Intact, Remote Intact Is patient IDD: No Insight: Good Impulse Control: Poor Appetite: Good Have you had any weight changes? : No Change Sleep: No Change Total Hours of Sleep: 6 Vegetative Symptoms: None  ADLScreening [facility] Assessment Services) Patient's cognitive ability adequate to safely complete daily activities?: Yes Patient able to express need for assistance with ADLs?: Yes Independently performs ADLs?: Yes (appropriate for developmental age)  Prior Inpatient Therapy Prior Inpatient Therapy: No  Prior Outpatient Therapy Prior Outpatient Therapy: No Does patient have an ACCT team?: No Does patient have Intensive In-House Services?  :  No Does patient have [facility] services? : No Does patient have P4CC services?: No  ADL Screening (condition at time of admission) Patient's cognitive ability adequate to safely complete daily activities?: Yes Is the patient deaf or have difficulty hearing?: No Does the patient have difficulty seeing, even when wearing glasses/contacts?: No Does the patient have difficulty concentrating, remembering, or making  decisions?: No Patient able to express need for assistance with ADLs?: Yes Does the patient have difficulty dressing or bathing?: No Independently performs ADLs?: Yes (appropriate for developmental age) Does the patient have difficulty walking or climbing stairs?: No Weakness of Legs: None Weakness of Arms/Hands: None  Home Assistive Devices/Equipment Home Assistive Devices/Equipment: None  Therapy Consults (therapy consults require a physician order) PT Evaluation Needed: No OT Evalulation Needed: No SLP Evaluation Needed: No Abuse/Neglect Assessment (Assessment to be complete while patient is alone) Abuse/Neglect Assessment Can Be Completed: Unable to assess, patient is non-responsive or altered mental status Values / Beliefs Cultural Requests During Hospitalization: None Spiritual Requests During Hospitalization: None Consults Spiritual Care Consult Needed: No Social Work Consult Needed: No Merchant navy officerAdvance Directives (For Healthcare) Does Patient Have a Medical Advance Directive?: No Would patient like information on creating a medical advance directive?: No - Patient declined       Disposition: Awaiting disposition   Disposition Initial Assessment Completed for this Encounter: Yes  On Site Evaluation by:   Reviewed with Physician:    Ralph DowdySamantha L Cena Bruhn 08/11/2018 7:21 AM

## 2018-08-11 NOTE — ED Notes (Signed)
IVC/Consult ordered ?

## 2018-08-11 NOTE — ED Notes (Signed)
Pt provided with 8oz of water per pt request.

## 2018-08-11 NOTE — Tx Team (Signed)
Initial Treatment Plan 08/11/2018 10:56 PM [name] [id number]    PATIENT STRESSORS: Health problems Substance abuse   PATIENT STRENGTHS: Ability for insight Active sense of humor Average or above average intelligence Capable of independent living Communication skills Motivation for treatment/growth Physical Health Supportive family/friends   PATIENT IDENTIFIED PROBLEMS: "depression"  "worrying"  "substance abuse"                 DISCHARGE CRITERIA:  Ability to meet basic life and health needs Improved stabilization in mood, thinking, and/or behavior Medical problems require only outpatient monitoring   PRELIMINARY DISCHARGE PLAN: Attend 12-step recovery group Participate in family therapy Return to previous living arrangement  PATIENT/FAMILY INVOLVEMENT: This treatment plan has been presented to and reviewed with the patient, Michael Nixon.  The patient and family have been given the opportunity to ask questions and make suggestions.  Baron Sane, RN 08/11/2018, 10:56 PM
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
## 2018-08-11 NOTE — ED Notes (Signed)
Pt provided with a lunch tray and fluids.  

## 2018-08-11 NOTE — ED Notes (Signed)
Pt given breakfast tray at this time. 

## 2018-08-11 NOTE — ED Notes (Signed)
Pt given meal tray and drink. Pt eating at this time. 
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
## 2018-08-11 NOTE — ED Provider Notes (Signed)
[facility] Emergency Department Provider Note   First MD Initiated Contact with Patient 08/11/18 [phone]     (approximate)  I have reviewed the triage vital signs and the nursing notes.   HISTORY  Chief Complaint Post-op Problem and Mental Health Problem   HPI Marce Charlesworth is a 33 y.o. male presents to the emergency department voluntarily with [facility] department.  Sheriff's officer states that call was because his wife stated that the patient stated he wanted to end it all and to go get into the gun safe".  Patient denies this allegation.  Patient states that he has been arguing with his wife recently including tonight.  Patient denies any suicidal ideation.  Patient also denies any homicidal ideation.       History reviewed. No pertinent past medical history.  Patient Active Problem List   Diagnosis Date Noted  . Allergic rhinitis 11/28/2013  . Impaired vision 11/28/2013    Past Surgical History:  Procedure Laterality Date  . EYE MUSCLE SURGERY      Prior to Admission medications   Medication Sig Start Date End Date Taking? Authorizing Provider  cetirizine (ZYRTEC) 10 MG tablet Take 10 mg by mouth daily as needed for allergies.    [provider]  pantoprazole (PROTONIX) 20 MG tablet Take 1 tablet (20 mg total) by mouth daily. 09/18/17 09/18/18  Rudene Re, MD    Allergies Aspirin and Bee venom  No family history on file.  Social History Social History   Tobacco Use  . Smoking status: Never Smoker  . Smokeless tobacco: Never Used  Substance Use Topics  . Alcohol use: Not on file  . Drug use: Not on file    Review of Systems Constitutional: No fever/chills Eyes: No visual changes. ENT: No sore throat. Cardiovascular: Denies chest pain. Respiratory: Denies shortness of breath. Gastrointestinal: No abdominal pain.  No nausea, no vomiting.  No diarrhea.  No constipation. Genitourinary: Negative for  dysuria. Musculoskeletal: Negative for neck pain.  Negative for back pain. Integumentary: Negative for rash. Neurological: Negative for headaches, focal weakness or numbness. Psychiatric:  Ported suicidal ideation  ____________________________________________   PHYSICAL EXAM:  VITAL SIGNS: ED Triage Vitals  Enc Vitals Group     BP 08/10/18 2255 (!) 173/90     Pulse Rate 08/10/18 2255 (!) 129     Resp 08/10/18 2255 19     Temp 08/10/18 2255 99.6 F (37.6 C)     Temp Source 08/10/18 2255 Oral     SpO2 08/10/18 2255 97 %     Weight 08/10/18 2304 81.6 kg (180 lb)     Height 08/10/18 2304 1.778 m (5\' 10" )     Head Circumference --      Peak Flow --      Pain Score 08/10/18 2256 5     Pain Loc --      Pain Edu? --      Excl. in [place]? --     Constitutional: Alert and oriented. Well appearing and in no acute distress. Eyes: Conjunctivae are normal. Mouth/Throat: Mucous membranes are moist.  Oropharynx non-erythematous. Neck: No stridor. Cardiovascular: Normal rate, regular rhythm. Good peripheral circulation. Grossly normal heart sounds. Respiratory: Normal respiratory effort.  No retractions. No audible wheezing. Gastrointestinal: Soft and nontender. No distention.  Musculoskeletal: No lower extremity tenderness nor edema. No gross deformities of extremities. Neurologic:  Normal speech and language. No gross focal neurologic deficits are appreciated.  Skin:  Skin is warm,  dry and intact. No rash noted. Psychiatric: Mood and affect are normal. Speech and behavior are normal.  ____________________________________________   LABS (all labs ordered are listed, but only abnormal results are displayed)  Labs Reviewed  COMPREHENSIVE METABOLIC PANEL - Abnormal; Notable for the following components:      Result Value   Glucose, Bld 111 (*)    BUN 23 (*)    Calcium 8.8 (*)    Total Protein 8.2 (*)    All other components within normal limits  ETHANOL - Abnormal; Notable for the  following components:   Alcohol, Ethyl (B) 232 (*)    All other components within normal limits  ACETAMINOPHEN LEVEL - Abnormal; Notable for the following components:   Acetaminophen (Tylenol), Serum <10 (*)    All other components within normal limits  CBC - Abnormal; Notable for the following components:   RDW 11.3 (*)    All other components within normal limits  SALICYLATE LEVEL  URINE DRUG SCREEN, QUALITATIVE ([facility] ONLY)     Procedures   ____________________________________________   INITIAL IMPRESSION / MDM / ASSESSMENT AND PLAN / ED COURSE  As part of my medical decision making, I reviewed the following data within the electronic MEDICAL RECORD NUMBER   33 year old male presenting with above-stated history and physical exam with reported suicidal ideation.  Patient will be evaluated by TTS and psychiatry staff.  Patient is voluntary at this time however I instructed the patient that in the event he chose to leave I would have to involuntarily commit him.  Patient states that would not be necessary as he is willing to stay and speak with a psychiatrist.  ____________________________________________  FINAL CLINICAL IMPRESSION(S) / ED DIAGNOSES  Final diagnoses:  Alcoholic intoxication without complication (HCC)     MEDICATIONS GIVEN DURING THIS VISIT:  Medications - No data to display   ED Discharge Orders    None      *Please note:  Lynann BeaverMatthew Heckler was evaluated in Emergency Department on 08/11/2018 for the symptoms described in the history of present illness. He was evaluated in the context of the global COVID-19 pandemic, which necessitated consideration that the patient might be at risk for infection with the SARS-CoV-2 virus that causes COVID-19. Institutional protocols and algorithms that pertain to the evaluation of patients at risk for COVID-19 are in a state of rapid change based on information released by regulatory bodies including the CDC and federal and state  organizations. These policies and algorithms were followed during the patient's care in the ED.  Some ED evaluations and interventions may be delayed as a result of limited staffing during the pandemic.*  Note:  This document was prepared using Dragon voice recognition software and may include unintentional dictation errors.   Darci CurrentBrown, [place] N, MD 08/11/18 [phone]

## 2018-08-11 NOTE — ED Notes (Signed)
Psychiatrist at bedside at this time.  

## 2018-08-11 NOTE — ED Provider Notes (Signed)
b ----------------------------------------- 2:43 PM on 08/11/2018 -----------------------------------------  And evaluated by psychiatry, they do recommend IVC, this will be performed.   Schuyler Amor, MD 08/11/18 [phone]

## 2018-08-11 NOTE — ED Notes (Signed)
This RN provided pt's belongs to sheriff. Pt is calm and cooperative at this time.

## 2018-08-11 NOTE — BH Assessment (Signed)
Referral information for Psychiatric Hospitalization faxed to;   [facility] [phone]),   . [place] [phone] or [phone])  . [facility] [phone]),   . [place] [phone]),   . [facility] [phone]).  [facility] [phone])

## 2018-08-11 NOTE — Consult Note (Signed)
[facility]   Reason for Consult: Suicide attempt Referring Physician:  Dr.McShane Patient Identification: Michael Nixon MRN:  [id number] Principal Diagnosis: <principal problem not specified> Diagnosis:  Active Problems:   * No active hospital problems. *   Total Time spent with patient: 20 minutes  Subjective:   Michael Nixon is a 33 y.o. male patient admitted with alcohol intoxication and making suicidal statements.  HPI: Patient is a 33 year old male who was brought in last night after he got intoxicated and voiced suicidal thoughts in the presence of their 35 year old daughter.  Patient had apparently held a gun to his head and threatened suicide.  He has a heavy alcohol problem and he admits to that.  However patient denies holding a gun and threatening suicide. States that he recently had got a vasectomy and his wife was very upset about the vasectomy.  Patient is interested in leaving and denied any depressed mood or other psychiatric symptoms. He denied any psychotic symptoms.  Denies use of any other drugs. Denied any previous psychiatric care or psychiatric hospitalizations.  Collateral-collateral obtained from his wife who reported that patient had voiced suicidal thoughts in the presence of the 536 year old daughter.  She reports that they have 30 guns in the home.  She reports that patient makes suicidal statements when he is intoxicated.  Apparently the other evening he was aggressive towards her.  She reports that she is not comfortable with him returning home due to his drinking and the guns in the house.  Past Psychiatric History: none  Risk to Self: Suicidal Ideation: (Pt denies, though his wife states he made multiple threats) Suicidal Intent: (Pt denies, though his wife states he made multiple threats) Is patient at risk for suicide?: (Pt denies, though his wife states he made multiple threats) Suicidal Plan?: (Pt's wife states pt went to his  gun safe & made threats) Access to Means: Yes Specify Access to Suicidal Means: Pt has a gun safe with guns in it What has been your use of drugs/alcohol within the last 12 months?: Pt acknowledges almost daily EtOH use How many times?: 0 Other Self Harm Risks: None noted Triggers for Past Attempts: None known Intentional Self Injurious Behavior: None Risk to Others: Homicidal Ideation: No Thoughts of Harm to Others: No Current Homicidal Intent: No Current Homicidal Plan: No Access to Homicidal Means: No Identified Victim: None noted History of harm to others?: No Assessment of Violence: On admission Violent Behavior Description: None noted Does patient have access to weapons?: Yes (Comment)(Pt has a gun safe with guns in it) Criminal Charges Pending?: No Does patient have a court date: No Prior Inpatient Therapy: Prior Inpatient Therapy: No Prior Outpatient Therapy: Prior Outpatient Therapy: No Does patient have an ACCT team?: No Does patient have Intensive In-House Services?  : No Does patient have [facility] services? : No Does patient have P4CC services?: No  Past Medical History: History reviewed. No pertinent past medical history.  Past Surgical History:  Procedure Laterality Date  . EYE MUSCLE SURGERY     Family History: No family history on file. Family Psychiatric  History: unknown Social History:  Social History   Substance and Sexual Activity  Alcohol Use None     Social History   Substance and Sexual Activity  Drug Use Not on file    Social History   Socioeconomic History  . Marital status: Married    Spouse name: Not on file  . Number of children: Not on file  .  Years of education: Not on file  . Highest education level: Not on file  Occupational History  . Not on file  Social Needs  . Financial resource strain: Not on file  . Food insecurity    Worry: Not on file    Inability: Not on file  . Transportation needs    Medical: Not on file     Non-medical: Not on file  Tobacco Use  . Smoking status: Never Smoker  . Smokeless tobacco: Never Used  Substance and Sexual Activity  . Alcohol use: Not on file  . Drug use: Not on file  . Sexual activity: Not on file  Lifestyle  . Physical activity    Days per week: Not on file    Minutes per session: Not on file  . Stress: Not on file  Relationships  . Social Musicianconnections    Talks on phone: Not on file    Gets together: Not on file    Attends religious service: Not on file    Active member of club or organization: Not on file    Attends meetings of clubs or organizations: Not on file    Relationship status: Not on file  Other Topics Concern  . Not on file  Social History Narrative  . Not on file   Additional Social History:    Allergies:   Allergies  Allergen Reactions  . Aspirin Hives  . Bee Venom     Labs:  Results for orders placed or performed during the hospital encounter of 08/11/18 (from the past 48 hour(s))  Comprehensive metabolic panel     Status: Abnormal   Collection Time: 08/10/18 11:06 PM  Result Value Ref Range   Sodium 139 135 - 145 mmol/L   Potassium 3.6 3.5 - 5.1 mmol/L   Chloride 107 98 - 111 mmol/L   CO2 22 22 - 32 mmol/L   Glucose, Bld 111 (H) 70 - 99 mg/dL   BUN 23 (H) 6 - 20 mg/dL   Creatinine, Ser 1.611.04 0.61 - 1.24 mg/dL   Calcium 8.8 (L) 8.9 - 10.3 mg/dL   Total Protein 8.2 (H) 6.5 - 8.1 g/dL   Albumin 4.6 3.5 - 5.0 g/dL   AST 26 15 - 41 U/L   ALT 31 0 - 44 U/L   Alkaline Phosphatase 67 38 - 126 U/L   Total Bilirubin 0.6 0.3 - 1.2 mg/dL   GFR calc non Af Amer >60 >60 mL/min   GFR calc Af Amer >60 >60 mL/min   Anion gap 10 5 - 15    Comment: Performed at [facility], [address] [place], [place] [address]  Ethanol     Status: Abnormal   Collection Time: 08/10/18 11:06 PM  Result Value Ref Range   Alcohol, Ethyl (B) 232 (H) <10 mg/dL    Comment: (NOTE) Lowest detectable limit for serum alcohol is 10 mg/dL. For  medical purposes only. Performed at [facility], [address] [place], [place] [address]   Salicylate level     Status: None   Collection Time: 08/10/18 11:06 PM  Result Value Ref Range   Salicylate Lvl <7.0 2.8 - 30.0 mg/dL    Comment: Performed at [facility], [address] [place], [place] [address]  Acetaminophen level     Status: Abnormal   Collection Time: 08/10/18 11:06 PM  Result Value Ref Range   Acetaminophen (Tylenol), Serum <10 (L) 10 - 30 ug/mL    Comment: (NOTE) Therapeutic  concentrations vary significantly. A range of 10-30 ug/mL  may be an effective concentration for many patients. However, some  are best treated at concentrations outside of this range. Acetaminophen concentrations >150 ug/mL at 4 hours after ingestion  and >50 ug/mL at 12 hours after ingestion are often associated with  toxic reactions. Performed at [facility], [address] [place], [place] [address]   cbc     Status: Abnormal   Collection Time: 08/10/18 11:06 PM  Result Value Ref Range   WBC 6.5 4.0 - 10.5 K/uL   RBC 5.09 4.22 - 5.81 MIL/uL   Hemoglobin 16.1 13.0 - 17.0 g/dL   HCT 44.9 39.0 - 52.0 %   MCV 88.2 80.0 - 100.0 fL   MCH 31.6 26.0 - 34.0 pg   MCHC 35.9 30.0 - 36.0 g/dL   RDW 11.3 (L) 11.5 - 15.5 %   Platelets 228 150 - 400 K/uL   nRBC 0.0 0.0 - 0.2 %    Comment: Performed at [facility], [address] [place], [place] [address]  Urine Drug Screen, Qualitative     Status: None   Collection Time: 08/10/18 11:06 PM  Result Value Ref Range   Tricyclic, Ur Screen NONE DETECTED NONE DETECTED   Amphetamines, Ur Screen NONE DETECTED NONE DETECTED   MDMA (Ecstasy)Ur Screen NONE DETECTED NONE DETECTED   Cocaine Metabolite,Ur [place] NONE DETECTED NONE DETECTED   Opiate, Ur Screen NONE DETECTED NONE DETECTED   Phencyclidine (PCP) Ur S NONE DETECTED NONE DETECTED   Cannabinoid 50 Ng, Ur [place] NONE DETECTED NONE DETECTED   Barbiturates, Ur  Screen NONE DETECTED NONE DETECTED   Benzodiazepine, Ur Scrn NONE DETECTED NONE DETECTED   Methadone Scn, Ur NONE DETECTED NONE DETECTED    Comment: (NOTE) Tricyclics + metabolites, urine    Cutoff 1000 ng/mL Amphetamines + metabolites, urine  Cutoff 1000 ng/mL MDMA (Ecstasy), urine              Cutoff 500 ng/mL Cocaine Metabolite, urine          Cutoff 300 ng/mL Opiate + metabolites, urine        Cutoff 300 ng/mL Phencyclidine (PCP), urine         Cutoff 25 ng/mL Cannabinoid, urine                 Cutoff 50 ng/mL Barbiturates + metabolites, urine  Cutoff 200 ng/mL Benzodiazepine, urine              Cutoff 200 ng/mL Methadone, urine                   Cutoff 300 ng/mL The urine drug screen provides only a preliminary, unconfirmed analytical test result and should not be used for non-medical purposes. Clinical consideration and professional judgment should be applied to any positive drug screen result due to possible interfering substances. A more specific alternate chemical method must be used in order to obtain a confirmed analytical result. Gas chromatography / mass spectrometry (GC/MS) is the preferred confirmat ory method. Performed at [facility], [address], [place], [place] [address]     Current Facility-Administered Medications  Medication Dose Route Frequency Provider Last Rate Last Dose  . LORazepam (ATIVAN) injection 0-4 mg  0-4 mg Intravenous Q6H Schuyler Amor, MD       Or  . LORazepam (ATIVAN) tablet 0-4 mg  0-4 mg Oral Q6H Schuyler Amor, MD      . Derrill Memo ON  08/13/2018] LORazepam (ATIVAN) injection 0-4 mg  0-4 mg Intravenous Q12H Jeanmarie PlantMcShane, James A, MD       Or  . Melene Muller[START ON 08/13/2018] LORazepam (ATIVAN) tablet 0-4 mg  0-4 mg Oral Q12H McShane, James A, MD      . thiamine (VITAMIN B-1) tablet 100 mg  100 mg Oral Daily Jeanmarie PlantMcShane, James A, MD       Or  . thiamine (B-1) injection 100 mg  100 mg Intravenous Daily Jeanmarie PlantMcShane, James A, MD       Current  Outpatient Medications  Medication Sig Dispense Refill  . azelastine (ASTELIN) 0.1 % nasal spray Place 1 spray into both nostrils daily.    . cetirizine (ZYRTEC) 10 MG tablet Take 10 mg by mouth daily as needed for allergies.    . pantoprazole (PROTONIX) 20 MG tablet Take 1 tablet (20 mg total) by mouth daily. (Patient not taking: Reported on 08/11/2018) 30 tablet 1    Musculoskeletal: Strength & Muscle Tone: within normal limits Gait & Station: normal Patient leans: N/A  Psychiatric Specialty Exam: Physical Exam  ROS  Blood pressure (!) 157/96, pulse (!) 106, temperature 98.8 F (37.1 C), temperature source Oral, resp. rate 18, height 5\' 10"  (1.778 m), weight 81.6 kg, SpO2 96 %.Body mass index is 25.83 kg/m.  General Appearance: Casual  Eye Contact:  Fair  Speech:  Clear and Coherent  Volume:  Normal  Mood:  Anxious and Depressed  Affect:  Constricted  Thought Process:  Coherent  Orientation:  Full (Time, Place, and Person)  Thought Content:  Logical  Suicidal Thoughts:  Yes.  with intent/plan  Homicidal Thoughts:  No  Memory:  Immediate;   Fair Recent;   Fair Remote;   Fair  Judgement:  Impaired  Insight:  Lacking  Psychomotor Activity:  Normal  Concentration:  Concentration: Fair and Attention Span: Fair  Recall:  [employer]Fair  Fund of Knowledge:  Fair  Language:  Fair  Akathisia:  No  Handed:  Right  AIMS (if indicated):     Assets:  Communication Skills Desire for Improvement Physical Health Social Support Vocational/Educational  ADL's:  Intact  Cognition:  WNL  Sleep:        Treatment Plan Summary: Daily contact with patient to assess and evaluate symptoms and progress in treatment and Medication management  Disposition: Recommend psychiatric Inpatient admission when medically cleared.  Patrick NorthHimabindu Rylynne Schicker, MD 08/11/2018 3:15 PM
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
## 2018-08-11 NOTE — ED Notes (Signed)
This RN received a phone call from [facility] counselor), per counselor pt will be admitted to [facility]. This RNprovided phone to pt per Kerry Dory (behavioral counselor) request. Pt on the phone speaking to wife and providing status/admission update to wife.

## 2018-08-12 DIAGNOSIS — F1024 Alcohol dependence with alcohol-induced mood disorder: Secondary | ICD-10-CM

## 2018-08-12 LAB — LIPID PANEL
Cholesterol: 184 mg/dL (ref 0–200)
HDL: 46 mg/dL (ref >40)
LDL Cholesterol: 113 mg/dL — ABNORMAL HIGH (ref 0–99)
Total CHOL/HDL Ratio: 4 RATIO
Triglycerides: 124 mg/dL (ref <150)
VLDL: 25 mg/dL (ref 0–40)

## 2018-08-12 LAB — HEMOGLOBIN A1C
Hgb A1c MFr Bld: 4.9 % (ref 4.8–5.6)
Mean Plasma Glucose: 93.93 mg/dL

## 2018-08-12 LAB — TSH: TSH: 2.535 u[IU]/mL (ref 0.350–4.500)

## 2018-08-12 MED ORDER — FOLIC ACID 1 MG PO TABS
1.0000 mg | ORAL_TABLET | Freq: Every day | ORAL | Status: DC
  Administered 2018-08-12 – 2018-08-15 (×4): 1 mg via ORAL
  Filled 2018-08-12 (×8): qty 1

## 2018-08-12 NOTE — Progress Notes (Signed)
D: Pt was in dayroom upon initial approach.  Pt presents with appropriate affect and depressed mood.  He reports his day has been "good" and his goal is "staying focused."  He reports he is going to direct his energy into positive things such as exercise.  He states "the group discussions are pretty therapeutic."  Pt denies SI/HI, denies hallucinations, denies pain.  Pt has been visible in milieu interacting with peers and staff appropriately.   A: Introduced self to pt.  Met with pt 1:1.  Actively listened to pt and offered support and encouragement. 15 minute safety checks in place.  R: Pt is safe on the unit.  Pt verbally contracts for safety.  Will continue to monitor and assess.
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
## 2018-08-12 NOTE — Progress Notes (Signed)
Met with pt 1:1.  Pt denies SI/HI, hallucinations, pain, and withdrawal symptoms.  He verbally contracts for safety and reports he will inform staff of needs and concerns.  Will continue to monitor and assess.

## 2018-08-12 NOTE — Progress Notes (Signed)
Pt rates depression 0, anxiety 2/10, and hopelessness 2/10. Pt denies SI/HI. Pt reports fair sleep at bedtime. Pt denies any withdrawal symptoms. Pt stated goal "work on being the person I need to be for myself and my family".  Orders reviewed with pt. Verbal support provided. Pt encouraged to attend groups. 15 minute checks performed for safety. Order obtained for jock strap.   Pt compliant with tx plan.

## 2018-08-12 NOTE — Progress Notes (Signed)
Recreation Therapy Notes  Date:  7.3.20 Time: 0930 Location: 300 [address] Dayroom  Group Topic: Stress Management  Goal Area(s) Addresses:  Patient will identify positive stress management techniques. Patient will identify benefits of using stress management post d/c.  Behavioral Response:  Engaged  Intervention: Stress Management  Activity :  Meditation.  LRT introduced the stress management technique of meditation.  LRT played a meditation that focused on making the most of your day.  Patients were to follow along a meditation played to engage.  Education:  Stress Management, Discharge Planning.   Education Outcome: Acknowledges Education  Clinical Observations/Feedback: Pt attended and participated in group.     Victorino Sparrow, LRT/CTRS     Victorino Sparrow A 08/12/2018 10:25 AM

## 2018-08-12 NOTE — Progress Notes (Signed)
CSW filed a [facility] CPS report with on call [employer] social worker, Apolonio Schneiders [phone]).  CSW expressed concerns of: substance abuse, home safety concerns in regard to access to firearms, and mental health concerns.  Per CPS Social Worker, Apolonio Schneiders, the report will be screened.  Stephanie Acre, LCSW-A Clinical Social Worker

## 2018-08-12 NOTE — BHH Suicide Risk Assessment (Signed)
[facility]   Nursing information obtained from:  Patient Demographic factors:  Male, Access to firearms, Caucasian Current Mental Status:  Suicidal ideation indicated by patient, Suicidal ideation indicated by others, Self-harm thoughts Loss Factors:  Decline in physical health Historical Factors:  Impulsivity Risk Reduction Factors:  Employed, Positive coping skills or problem solving skills, Responsible for children under 35 years of age, Living with another person, especially a relative, Sense of responsibility to family, Positive social support, Positive therapeutic relationship  Total Time spent with patient: 30 minutes Principal Problem: <principal problem not specified> Diagnosis:  Active Problems:   Severe recurrent major depression without psychotic features (HCC)  Subjective Data: Patient is seen and examined.  Patient is a 33 year old male with a probable past psychiatric history significant for alcohol dependence who [facility] the [facility]11/2018 secondary to concern for suicidal thoughts.  Patient stated he had recently had a vasectomy.  He stated that it is been complicated with more pain than he had anticipated.  He stated that he has been drinking excessively for quite a while, but that his alcohol intake had increased secondary to discomfort from the vasectomy.  He stated he and his wife had gotten into arguments over this.  He stated he was upset over the fact that this was so painful for him.  The patient stated that his wife told him that he had threatened to kill himself.  He does not recall that.  He stated that he had been drinking several drinks that day.  He stated that he was unsure how much she had drank, but his blood alcohol in the emergency room was 235.  The patient stated that he had not harmed himself in the past or thought about suicide.  The collateral information from the emergency room revealed that the  patient had voiced suicidal ideation in the past in front of their 35 year old daughter.  Reportedly there are multiple guns in the home.  She also reported that the patient make suicidal statements when he was intoxicated.  Apparently on the evening prior to admission he had been aggressive towards her.  She also reported that she is not comfortable with him returning home secondary to his drinking and the weapons in the home.  He denied all of this.  He denied any helplessness, hopelessness or worthlessness.  He denied any previous alcohol withdrawal symptoms.  He denied any previous seizures.  He denied any hallucinations from alcohol withdrawal.  His family has a strong family history of alcoholism.  He was admitted to the hospital for evaluation and stabilization.  Continued Clinical Symptoms:  Alcohol Use Disorder Identification Test Final Score (AUDIT): 10 The "Alcohol Use Disorders Identification Test", Guidelines for Use in Primary Care, Second Edition.  World Science writerHealth Organization [facility](WHO). Score between 0-7:  no or low risk or alcohol related problems. Score between 8-15:  moderate risk of alcohol related problems. Score between 16-19:  high risk of alcohol related problems. Score 20 or above:  warrants further diagnostic evaluation for alcohol dependence and treatment.   CLINICAL FACTORS:   Alcohol/Substance Abuse/Dependencies   Musculoskeletal: Strength & Muscle Tone: within normal limits Gait & Station: normal Patient leans: N/A  Psychiatric Specialty Exam: Physical Exam  Nursing note and vitals reviewed. Constitutional: He is oriented to person, place, and time. He appears well-developed and well-nourished.  HENT:  Head: Normocephalic and atraumatic.  Respiratory: Effort normal.  Neurological: He is alert and oriented to person, place,  and time.    ROS  Blood pressure (!) 152/78, pulse (!) 107, temperature 98.4 F (36.9 C), temperature source Oral, resp. rate 18, height 5'  10" (1.778 m), weight 79.4 kg, SpO2 98 %.Body mass index is 25.11 kg/m.  General Appearance: Casual  Eye Contact:  Good  Speech:  Normal Rate  Volume:  Normal  Mood:  Anxious  Affect:  Congruent  Thought Process:  Coherent and Descriptions of Associations: Intact  Orientation:  Full (Time, Place, and Person)  Thought Content:  Logical  Suicidal Thoughts:  No  Homicidal Thoughts:  No  Memory:  Immediate;   Fair Recent;   Fair Remote;   Fair  Judgement:  Impaired  Insight:  Fair  Psychomotor Activity:  Increased  Concentration:  Concentration: Fair and Attention Span: Fair  Recall:  [employer] of Knowledge:  Fair  Language:  Good  Akathisia:  Negative  Handed:  Right  AIMS (if indicated):     Assets:  Desire for Improvement Resilience  ADL's:  Intact  Cognition:  WNL  Sleep:  Number of Hours: 6      COGNITIVE FEATURES THAT CONTRIBUTE TO RISK:  None    SUICIDE RISK:   Mild:  Suicidal ideation of limited frequency, intensity, duration, and specificity.  There are no identifiable plans, no associated intent, mild dysphoria and related symptoms, good self-control (both objective and subjective assessment), few other risk factors, and identifiable protective factors, including available and accessible social support.  PLAN OF CARE: Patient is seen and examined.  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  He will be admitted to the hospital.  He will be integrated into the milieu.  He will be encouraged to attend groups.  He will be placed on Librium 25 mg p.o. every 6 hours PRN a CIWA greater than 10.  He will also be placed on thiamine as well as folic acid.  His liver function enzymes were normal.  His MCV was normal.  Platelets were normal.  TSH was normal, blood alcohol again was 232.  Drug screen was completely negative.  We will contact the wife for collateral  information.  I will hold off on any antidepressant medications at this time.  I certify that inpatient services furnished can reasonably be expected to improve the patient's condition.   Sharma Covert, MD 08/12/2018, 9:46 AM

## 2018-08-12 NOTE — Tx Team (Signed)
Interdisciplinary Treatment and Diagnostic Plan Update  08/12/2018 Time of Session: 9:00am Michael Nixon MRN: [id number]  Principal Diagnosis: <principal problem not specified>  Secondary Diagnoses: Active Problems:   Severe recurrent major depression without psychotic features (HCC)   Current Medications:  Current Facility-Administered Medications  Medication Dose Route Frequency Provider Last Rate Last Dose  . acetaminophen (TYLENOL) tablet 650 mg  650 mg Oral Q6H PRN Rozetta Nunnery, NP      . alum & mag hydroxide-simeth (MAALOX/MYLANTA) 200-200-20 MG/5ML suspension 30 mL  30 mL Oral Q4H PRN Lindon Romp A, NP      . chlordiazePOXIDE (LIBRIUM) capsule 25 mg  25 mg Oral Q6H PRN Rozetta Nunnery, NP      . folic acid (FOLVITE) tablet 1 mg  1 mg Oral Daily Sharma Covert, MD      . hydrOXYzine (ATARAX/VISTARIL) tablet 25 mg  25 mg Oral TID PRN Rozetta Nunnery, NP      . loperamide (IMODIUM) capsule 2-4 mg  2-4 mg Oral PRN Lindon Romp A, NP      . magnesium hydroxide (MILK OF MAGNESIA) suspension 30 mL  30 mL Oral Daily PRN Lindon Romp A, NP      . multivitamin with minerals tablet 1 tablet  1 tablet Oral Daily Lindon Romp A, NP   1 tablet at 08/12/18 0804  . ondansetron (ZOFRAN-ODT) disintegrating tablet 4 mg  4 mg Oral Q6H PRN Lindon Romp A, NP      . thiamine (VITAMIN B-1) tablet 100 mg  100 mg Oral Daily Lindon Romp A, NP   100 mg at 08/12/18 0804  . traZODone (DESYREL) tablet 50 mg  50 mg Oral QHS PRN Rozetta Nunnery, NP       PTA Medications: Medications Prior to Admission  Medication Sig Dispense Refill Last Dose  . azelastine (ASTELIN) 0.1 % nasal spray Place 1 spray into both nostrils daily.     . cetirizine (ZYRTEC) 10 MG tablet Take 10 mg by mouth daily as needed for allergies.     . pantoprazole (PROTONIX) 20 MG tablet Take 1 tablet (20 mg total) by mouth daily. (Patient not taking: Reported on 08/11/2018) 30 tablet 1     Patient Stressors: Health problems Substance  abuse  Patient Strengths: Ability for insight Active sense of humor Average or above average intelligence Capable of independent living Communication skills Motivation for treatment/growth Physical Health Supportive family/friends  Treatment Modalities: Medication Management, Group therapy, Case management,  1 to 1 session with clinician, Psychoeducation, Recreational therapy.   Physician Treatment Plan for Primary Diagnosis: <principal problem not specified> Long Term Goal(s):     Short Term Goals:    Medication Management: Evaluate patient's response, side effects, and tolerance of medication regimen.  Therapeutic Interventions: 1 to 1 sessions, Unit Group sessions and Medication administration.  Evaluation of Outcomes: Not Met  Physician Treatment Plan for Secondary Diagnosis: Active Problems:   Severe recurrent major depression without psychotic features ([place])  Long Term Goal(s):     Short Term Goals:       Medication Management: Evaluate patient's response, side effects, and tolerance of medication regimen.  Therapeutic Interventions: 1 to 1 sessions, Unit Group sessions and Medication administration.  Evaluation of Outcomes: Not Met   RN Treatment Plan for Primary Diagnosis: <principal problem not specified> Long Term Goal(s): Knowledge of disease and therapeutic regimen to maintain health will improve  Short Term Goals: Ability to verbalize frustration and anger appropriately will improve, Ability  to disclose and discuss suicidal ideas and Ability to identify and develop effective coping behaviors will improve  Medication Management: RN will administer medications as ordered by provider, will assess and evaluate patient's response and provide education to patient for prescribed medication. RN will report any adverse and/or side effects to prescribing provider.  Therapeutic Interventions: 1 on 1 counseling sessions, Psychoeducation, Medication administration,  Evaluate responses to treatment, Monitor vital signs and CBGs as ordered, Perform/monitor CIWA, COWS, AIMS and Fall Risk screenings as ordered, Perform wound care treatments as ordered.  Evaluation of Outcomes: Not Met   LCSW Treatment Plan for Primary Diagnosis: <principal problem not specified> Long Term Goal(s): Safe transition to appropriate next level of care at discharge, Engage patient in therapeutic group addressing interpersonal concerns.  Short Term Goals: Engage patient in aftercare planning with referrals and resources, Increase social support, Identify triggers associated with mental health/substance abuse issues and Increase skills for wellness and recovery  Therapeutic Interventions: Assess for all discharge needs, 1 to 1 time with Social worker, Explore available resources and support systems, Assess for adequacy in community support network, Educate family and significant other(s) on suicide prevention, Complete Psychosocial Assessment, Interpersonal group therapy.  Evaluation of Outcomes: Not Met   Progress in Treatment: Attending groups: No. New to unit. Participating in groups: No. Taking medication as prescribed: Yes. Toleration medication: Yes. Family/Significant other contact made: No, will contact:  supports if consents are granted Patient understands diagnosis: Yes. Discussing patient identified problems/goals with staff: Yes. Medical problems stabilized or resolved: No. Denies suicidal/homicidal ideation: Yes. Issues/concerns per patient self-inventory: Yes.  New problem(s) identified: Yes, Describe:  Maritial stress, access to firearms  New Short Term/Long Term Goal(s): detox, medication management for mood stabilization; elimination of SI thoughts; development of comprehensive mental wellness/sobriety plan.  Patient Goals:    Discharge Plan or Barriers: CSW continuing to assess for appropriate referrals.   Reason for Continuation of Hospitalization:  Anxiety Depression Medical Issues  Estimated Length of Stay: 3-5 days  Attendees: Patient: 08/12/2018 10:14 AM  Physician:  08/12/2018 10:14 AM  Nursing:  08/12/2018 10:14 AM  RN Care Manager: 08/12/2018 10:14 AM  Social Worker:  Stephanie Acre, [place] 08/12/2018 10:14 AM  Recreational Therapist:  08/12/2018 10:14 AM  Other:  08/12/2018 10:14 AM  Other:  08/12/2018 10:14 AM  Other: 08/12/2018 10:14 AM    Scribe for Treatment Team: Joellen Jersey, [place] 08/12/2018 10:14 AM

## 2018-08-13 DIAGNOSIS — F332 Major depressive disorder, recurrent severe without psychotic features: Principal | ICD-10-CM

## 2018-08-13 NOTE — Progress Notes (Signed)
[facility]  08/13/2018 9:34 AM Michael BeaverMatthew Nixon  MRN:  [id number]  Subjective:  Michael Nixon reported " I am feeling better and I know that I need to stop drinking."   Evaluation: Michael Nixon observed sitting in day room interacting with peers.  He presents pleasant calm and cooperative.  Currently denying suicidal or homicidal ideations.  Denies auditory or visual hallucinations.  Patient reports he has been able to follow-up with his wife and reports plans to attend couples therapy and AA after his discharge.  He denies depression or depressive symptoms.  Reports verbal disagreement between he and his wife over his vasectomy was the contention of this disagreement.  He denies alcohol cravings and/or withdrawal symptoms.  Reports a good appetite.  States he is resting well throughout the night.  Patient reports he is hopeful to discharge soon. support encouragement reassurance was provided.   Principal Problem: Severe recurrent major depression without psychotic features (HCC) Diagnosis: Principal Problem:   Severe recurrent major depression without psychotic features (HCC)  Total Time spent with patient: 15 minutes  Past Psychiatric History:   Past Medical History: History reviewed. No pertinent past medical history.  Past Surgical History:  Procedure Laterality Date  . EYE MUSCLE SURGERY     Family History: History reviewed. No pertinent family history. Family Psychiatric  History:  Social History:  Social History   Substance and Sexual Activity  Alcohol Use Yes  . Alcohol/week: 4.0 - 5.0 standard drinks  . Types: 4 - 5 Cans of beer per week     Social History   Substance and Sexual Activity  Drug Use Never    Social History   Socioeconomic History  . Marital status: Married    Spouse name: Not on file  . Number of children: Not on file  . Years of education: Not on file  . Highest education level: Not on file  Occupational History  . Not on file  Social Needs  .  Financial resource strain: Not on file  . Food insecurity    Worry: Not on file    Inability: Not on file  . Transportation needs    Medical: Not on file    Non-medical: Not on file  Tobacco Use  . Smoking status: Never Smoker  . Smokeless tobacco: Never Used  Substance and Sexual Activity  . Alcohol use: Yes    Alcohol/week: 4.0 - 5.0 standard drinks    Types: 4 - 5 Cans of beer per week  . Drug use: Never  . Sexual activity: Not Currently  Lifestyle  . Physical activity    Days per week: Not on file    Minutes per session: Not on file  . Stress: Not on file  Relationships  . Social Musicianconnections    Talks on phone: Not on file    Gets together: Not on file    Attends religious service: Not on file    Active member of club or organization: Not on file    Attends meetings of clubs or organizations: Not on file    Relationship status: Not on file  Other Topics Concern  . Not on file  Social History Narrative  . Not on file   Additional Social History:                         Sleep: Fair  Appetite:  Fair  Current Medications: Current Facility-Administered Medications  Medication Dose Route Frequency Provider Last Rate Last  Dose  . acetaminophen (TYLENOL) tablet 650 mg  650 mg Oral Q6H PRN Jackelyn PolingBerry, Jason A, NP      . alum & mag hydroxide-simeth (MAALOX/MYLANTA) 200-200-20 MG/5ML suspension 30 mL  30 mL Oral Q4H PRN Nira ConnBerry, Jason A, NP      . chlordiazePOXIDE (LIBRIUM) capsule 25 mg  25 mg Oral Q6H PRN Nira ConnBerry, Jason A, NP      . folic acid (FOLVITE) tablet 1 mg  1 mg Oral Daily Antonieta Pertlary, Greg Lawson, MD   1 mg at 08/13/18 [id number]  . hydrOXYzine (ATARAX/VISTARIL) tablet 25 mg  25 mg Oral TID PRN Jackelyn PolingBerry, Jason A, NP      . loperamide (IMODIUM) capsule 2-4 mg  2-4 mg Oral PRN Nira ConnBerry, Jason A, NP      . magnesium hydroxide (MILK OF MAGNESIA) suspension 30 mL  30 mL Oral Daily PRN Nira ConnBerry, Jason A, NP      . multivitamin with minerals tablet 1 tablet  1 tablet Oral Daily Nira ConnBerry,  Jason A, NP   1 tablet at 08/13/18 [phone] [id number]  . ondansetron (ZOFRAN-ODT) disintegrating tablet 4 mg  4 mg Oral Q6H PRN Nira ConnBerry, Jason A, NP      . thiamine (VITAMIN B-1) tablet 100 mg  100 mg Oral Daily Nira ConnBerry, Jason A, NP   100 mg at 08/13/18 0821  . traZODone (DESYREL) tablet 50 mg  50 mg Oral QHS PRN Jackelyn PolingBerry, Jason A, NP        Lab Results:  Results for orders placed or performed during the hospital encounter of 08/11/18 (from the past 48 hour(s))  TSH     Status: None   Collection Time: 08/12/18  6:33 AM  Result Value Ref Range   TSH 2.535 0.350 - 4.500 uIU/mL    Comment: Performed by a 3rd Generation assay with a functional sensitivity of <=0.01 uIU/mL. Performed at [facility], [address] [place], [place] [address]   Lipid panel     Status: Abnormal   Collection Time: 08/12/18  6:33 AM  Result Value Ref Range   Cholesterol 184 0 - 200 mg/dL   Triglycerides 119124 <147<150 mg/dL   HDL 46 >82>40 mg/dL   Total CHOL/HDL Ratio 4.0 RATIO   VLDL 25 0 - 40 mg/dL   LDL Cholesterol 956113 (H) 0 - 99 mg/dL    Comment:        Total Cholesterol/HDL:CHD Risk Coronary Heart Disease Risk Table                     Men   Women  1/2 Average Risk   3.4   3.3  Average Risk       5.0   4.4  2 X Average Risk   9.6   7.1  3 X Average Risk  23.4   11.0        Use the calculated Patient Ratio above and the CHD Risk Table to determine the patient's CHD Risk.        ATP III CLASSIFICATION (LDL):  <100     mg/dL   Optimal  [phone]  mg/dL   Near or Above                    Optimal  130-159  mg/dL   Borderline  [phone]  mg/dL   High  >629>190     mg/dL   Very High Performed at [facility], [address] [place], [place] [address]  Hemoglobin A1c     Status: None   Collection Time: 08/12/18  6:33 AM  Result Value Ref Range   Hgb A1c MFr Bld 4.9 4.8 - 5.6 %    Comment: (NOTE) Pre diabetes:          5.7%-6.4% Diabetes:              >6.4% Glycemic control for    <7.0% adults with diabetes    Mean Plasma Glucose 93.93 mg/dL    Comment: Performed at [place] [address], [place], [place] [address]    Blood Alcohol level:  Lab Results  Component Value Date   ETH 232 (H) 63/84/6659    Metabolic Disorder Labs: Lab Results  Component Value Date   HGBA1C 4.9 08/12/2018   MPG 93.93 08/12/2018   No results found for: PROLACTIN Lab Results  Component Value Date   CHOL 184 08/12/2018   TRIG 124 08/12/2018   HDL 46 08/12/2018   CHOLHDL 4.0 08/12/2018   VLDL 25 08/12/2018   LDLCALC 113 (H) 08/12/2018    Physical Findings: AIMS: Facial and Oral Movements Muscles of Facial Expression: None, normal Lips and Perioral Area: None, normal Jaw: None, normal Tongue: None, normal,Extremity Movements Upper (arms, wrists, hands, fingers): None, normal Lower (legs, knees, ankles, toes): None, normal, Trunk Movements Neck, shoulders, hips: None, normal, Overall Severity Severity of abnormal movements (highest score from questions above): None, normal Incapacitation due to abnormal movements: None, normal Patient's awareness of abnormal movements (rate only patient's report): No Awareness, Dental Status Current problems with teeth and/or dentures?: No Does patient usually wear dentures?: No  CIWA:  CIWA-Ar Total: 2 COWS:     Musculoskeletal: Strength & Muscle Tone: within normal limits Gait & Station: normal Patient leans: N/A  Psychiatric Specialty Exam: Physical Exam  Review of Systems  Psychiatric/Behavioral: Positive for depression. The patient is nervous/anxious.   All other systems reviewed and are negative.   Blood pressure (!) 141/86, pulse 87, temperature 97.6 F (36.4 C), temperature source Oral, resp. rate 18, height 5\' 10"  (1.778 m), weight 79.4 kg, SpO2 98 %.Body mass index is 25.11 kg/m.  General Appearance: Casual  Eye Contact:  Good  Speech:  Clear and Coherent  Volume:  Normal  Mood:  Anxious and Depressed   Affect:  Congruent and Non-Congruent  Thought Process:  Coherent  Orientation:  Full (Time, Place, and Person)  Thought Content:  Hallucinations: None  Suicidal Thoughts:  No  Homicidal Thoughts:  No  Memory:  Immediate;   Fair Recent;   Fair  Judgement:  Fair  Insight:  Fair  Psychomotor Activity:  Normal  Concentration:  Concentration: Fair  Recall:  [employer] of Knowledge:  Fair  Language:  Fair  Akathisia:  No  Handed:  Right  AIMS (if indicated):     Assets:  Communication Skills Desire for Improvement Resilience Social Support  ADL's:  Intact  Cognition:  WNL  Sleep:  Number of Hours: 6.25     Treatment Plan Summary: Daily contact with patient to assess and evaluate symptoms and progress in treatment and Medication management   Continue with current treatment plan on 08/13/2018 as listed below except where noted  Major Depression:  Continue Trazodone 50 mg PRN QHS Continue Librium 25 mg for CIWA  Withdrawal  CSW to continue working on discharge disposition Patient encouraged to participated  within the therapeutic milieu     Derrill [place], NP 08/13/2018, 9:34 AM

## 2018-08-13 NOTE — BHH Group Notes (Signed)
[place] Group Notes:  (Nursing)  Date:  08/13/2018  Time:100 PM Type of Therapy:  Nurse Education  Participation Level:  Did Not Attend   Waymond Cera 08/13/2018, 4:37 PM

## 2018-08-13 NOTE — Plan of Care (Signed)
  Problem: Activity: Goal: Sleeping patterns will improve Note: Pt slept 6 hours last night per flowsheet.

## 2018-08-13 NOTE — Progress Notes (Signed)
D: Pt was in dayroom upon initial approach.  Pt presents with appropriate affect and mood.  He describes his day as "good."  His goal is "going home" and he discusses how he has a meeting in the morning to discuss potential discharge date.  He states "I want to get back to my wife and my children."  Pt denies SI/HI, denies hallucinations, denies pain.  Pt has been visible in milieu interacting with peers and staff appropriately.  Pt attended evening group.    A: Met with pt 1:1.  Actively listened to pt and offered support and encouragement. 15 minute safety checks in place.  R: Pt is safe on the unit.  Pt verbally contracts for safety.  Will continue to monitor and assess.
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
## 2018-08-13 NOTE — Progress Notes (Signed)
D. Pt is friendly upon approach, calm and cooperative behavior. Visible in the milieu interacting well with peers and stafft Per pt's self inventory, pt rated his depression, hopelessness and anxiety a 0/0/2, respectively. Pt currently denies SI/HI and AVH Pt writes that his goal today is "being positive and continuing to make positive changes in myself so these changes will also transfer to my family. A. Labs and vitals monitored. Pt given and educated on medications. Pt supported emotionally and encouraged to express concerns and ask questions.   R. Pt remains safe with 15 minute checks. Will continue POC.

## 2018-08-13 NOTE — Progress Notes (Signed)
[place] NOVEL CORONAVIRUS (COVID-19) DAILY CHECK-OFF SYMPTOMS - answer yes or no to each - every day NO YES  Have you had a fever in the past 24 hours?  . Fever (Temp > 37.80C / 100F) X   Have you had any of these symptoms in the past 24 hours? . New Cough .  Sore Throat  .  Shortness of Breath .  Difficulty Breathing .  Unexplained Body Aches   X   Have you had any one of these symptoms in the past 24 hours not related to allergies?   . Runny Nose .  Nasal Congestion .  Sneezing   X   If you have had runny nose, nasal congestion, sneezing in the past 24 hours, has it worsened?  X   EXPOSURES - check yes or no X   Have you traveled outside the state in the past 14 days?  X   Have you been in contact with someone with a confirmed diagnosis of COVID-19 or PUI in the past 14 days without wearing appropriate PPE?  X   Have you been living in the same home as a person with confirmed diagnosis of COVID-19 or a PUI (household contact)?    X   Have you been diagnosed with COVID-19?    X              What to do next: Answered NO to all: Answered YES to anything:   Proceed with unit schedule Follow the [facility] Inpatient Flowsheet.   

## 2018-08-13 NOTE — Progress Notes (Signed)
[facility] Group Notes:  (Nursing/MHT/Case Management/Adjunct)  Date:  08/13/2018  Time:  2030  Type of Therapy:  wrap up group  Participation Level:  Active  Participation Quality:  Appropriate, Attentive, Sharing and Supportive  Affect:  Appropriate  Cognitive:  Appropriate  Insight:  Improving  Engagement in Group:  Engaged  Modes of Intervention:  Clarification, Education and Support  Summary of Progress/Problems: Pt reports enjoying his one on one session with the social worker today. Pt has concerns and questions about a CPS case that has been opened on him/his family. It was explained to pt that the case was probably opened because of the report of him threatening suicide in front of the child and the fact that there are firearms in the house but no other information given. Pt shares that he plans on never drinking again.  Pt reports being grateful for his kids and having a supportive roommate here at the hospital.   Shellia Cleverly 08/13/2018, 9:25 PM
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
## 2018-08-14 NOTE — [facility] Group Notes (Signed)
[facility] Group Notes: Nursing Education Group  Date:  08/14/2018  Time:  1:15 PM  Facilitator: Patty D., RN  Type of Therapy:  Nurse Education  Participation Level:  Active  Participation Quality:  Appropriate  Affect:  Appropriate  Cognitive:  Alert and Oriented  Insight:  Appropriate  Engagement in Group:  Developing/Improving  Modes of Intervention:  Activity, Discussion, Socialization and Support  Summary of Progress/Problems: Patient attended group and was not disruptive in the discussion.   Askia Hazelip A Keshawna Dix 08/14/2018, 1:15 PM 

## 2018-08-14 NOTE — [facility] Group Notes (Signed)
[place] LCSW Group Therapy Note  08/14/2018   10:00-11:00AM  Type of Therapy and Topic:  Group Therapy:  Unhealthy versus Healthy Supports, Which Am I?  Participation Level:  Active   Description of Group:  Patients in this group were introduced to the concept that additional supports including self-support are an essential part of recovery.  Initially a discussion was held about the differences between healthy versus unhealthy supports.  Patients were asked to share what healthy supports are helping them to achieve their goals and what unhealthy supports in their lives need to be addressed.   A song entitled "My Own Hero" was played and a group discussion ensued in which patients stated they could relate to the song and it inspired them to realize they have be willing to help themselves in order to succeed, because other people cannot achieve sobriety or stability for them.  We discussed adding a variety of healthy supports to address the various needs in patient lives, including becoming more self-supportive.  A song was then played called "I Am Enough" and people stated they related to this when it talked about being your own worst enemy.  A song was played called "I Know Where I've Been" toward the end of group and used to conduct an inspirational wrap-up to group of remembering how far they have already come in their journey.  Therapeutic Goals: 1)  Highlight the differences between healthy and unhealthy supports 2)  Suggest the importance of being a part of one's own support system  3)  Identify the patient's current support system   4) Elicit commitments to add healthy supports and to become more conscious of being self-supportive   Summary of Patient Progress:  The patient listed as healthy supports the following:  Wife sometimes, father, in-laws.  The patient expressed that unhealthy supports to be addressed include drinking buddies and wife sometimes.  Patient feels he is a healthy support for  himself.  Healthy supports which could be added for increased stability and happiness include professionals and AA.  Therapeutic Modalities:   Motivational Interviewing Activity  Maretta Los , MSW, LCSW

## 2018-08-14 NOTE — Progress Notes (Signed)
Patient's wife called wanting to relay message that all firearms have been removed from their home.

## 2018-08-14 NOTE — Progress Notes (Signed)
[place] NOVEL CORONAVIRUS (COVID-19) DAILY CHECK-OFF SYMPTOMS - answer yes or no to each - every day NO YES  Have you had a fever in the past 24 hours?  . Fever (Temp > 37.80C / 100F) X   Have you had any of these symptoms in the past 24 hours? . New Cough .  Sore Throat  .  Shortness of Breath .  Difficulty Breathing .  Unexplained Body Aches   X   Have you had any one of these symptoms in the past 24 hours not related to allergies?   . Runny Nose .  Nasal Congestion .  Sneezing   X   If you have had runny nose, nasal congestion, sneezing in the past 24 hours, has it worsened?  X   EXPOSURES - check yes or no X   Have you traveled outside the state in the past 14 days?  X   Have you been in contact with someone with a confirmed diagnosis of COVID-19 or PUI in the past 14 days without wearing appropriate PPE?  X   Have you been living in the same home as a person with confirmed diagnosis of COVID-19 or a PUI (household contact)?    X   Have you been diagnosed with COVID-19?    X              What to do next: Answered NO to all: Answered YES to anything:   Proceed with unit schedule Follow the [facility] Inpatient Flowsheet.   

## 2018-08-14 NOTE — Progress Notes (Signed)
[facility] MD Progress Note  08/14/2018 10:14 AM Michael Nixon  MRN:  [id number]  Subjective:  Michael Nixon reported " I was hoping to discharge today."   [place]: Patient observed sitting in day room interacting with peers.  Patient presents anxious but pleasant.  Denying suicidal or homicidal ideations.  Denies auditory or visual hallucinations.  Patient is inquiring about his discharge disposition plan.  NP followed up with social worker who reports child protective services (CPS) report pending resolution of guns in the gun safe.  Patient appeared irritable but receptive.  He denies depression or depressive symptoms.  Continues to express that his life has been supportive during admission.  Reports a good appetite.  States he is resting well throughout the night.  Reports taking and tolerating medications well.  Support, encouragement and  reassurance was provided.   Principal Problem: Severe recurrent major depression without psychotic features ([place]) Diagnosis: Principal Problem:   Severe recurrent major depression without psychotic features ([place])  Total Time spent with patient: 15 minutes  Past Psychiatric History:   Past Medical History: History reviewed. No pertinent past medical history.  Past Surgical History:  Procedure Laterality Date  . EYE MUSCLE SURGERY     Family History: History reviewed. No pertinent family history. Family Psychiatric  History:  Social History:  Social History   Substance and Sexual Activity  Alcohol Use Yes  . Alcohol/week: 4.0 - 5.0 standard drinks  . Types: 4 - 5 Cans of beer per week     Social History   Substance and Sexual Activity  Drug Use Never    Social History   Socioeconomic History  . Marital status: Married    Spouse name: Not on file  . Number of children: Not on file  . Years of education: Not on file  . Highest education level: Not on file  Occupational History  . Not on file  Social Needs  . Financial resource strain: Not on  file  . Food insecurity    Worry: Not on file    Inability: Not on file  . Transportation needs    Medical: Not on file    Non-medical: Not on file  Tobacco Use  . Smoking status: Never Smoker  . Smokeless tobacco: Never Used  Substance and Sexual Activity  . Alcohol use: Yes    Alcohol/week: 4.0 - 5.0 standard drinks    Types: 4 - 5 Cans of beer per week  . Drug use: Never  . Sexual activity: Not Currently  Lifestyle  . Physical activity    Days per week: Not on file    Minutes per session: Not on file  . Stress: Not on file  Relationships  . Social Herbalist on phone: Not on file    Gets together: Not on file    Attends religious service: Not on file    Active member of club or organization: Not on file    Attends meetings of clubs or organizations: Not on file    Relationship status: Not on file  Other Topics Concern  . Not on file  Social History Narrative  . Not on file   Additional Social History:                         Sleep: Fair  Appetite:  Fair  Current Medications: Current Facility-Administered Medications  Medication Dose Route Frequency Provider Last Rate Last Dose  . acetaminophen (TYLENOL) tablet 650 mg  650 mg Oral Q6H PRN Jackelyn PolingBerry, Jason A, NP      . alum & mag hydroxide-simeth (MAALOX/MYLANTA) 200-200-20 MG/5ML suspension 30 mL  30 mL Oral Q4H PRN Nira ConnBerry, Jason A, NP      . chlordiazePOXIDE (LIBRIUM) capsule 25 mg  25 mg Oral Q6H PRN Nira ConnBerry, Jason A, NP      . folic acid (FOLVITE) tablet 1 mg  1 mg Oral Daily Antonieta Pertlary, Greg Lawson, MD   1 mg at 08/14/18 0800  . hydrOXYzine (ATARAX/VISTARIL) tablet 25 mg  25 mg Oral TID PRN Nira ConnBerry, Jason A, NP      . loperamide (IMODIUM) capsule 2-4 mg  2-4 mg Oral PRN Nira ConnBerry, Jason A, NP      . magnesium hydroxide (MILK OF MAGNESIA) suspension 30 mL  30 mL Oral Daily PRN Nira ConnBerry, Jason A, NP      . multivitamin with minerals tablet 1 tablet  1 tablet Oral Daily Nira ConnBerry, Jason A, NP   1 tablet at 08/14/18  0800  . ondansetron (ZOFRAN-ODT) disintegrating tablet 4 mg  4 mg Oral Q6H PRN Nira ConnBerry, Jason A, NP      . thiamine (VITAMIN B-1) tablet 100 mg  100 mg Oral Daily Nira ConnBerry, Jason A, NP   100 mg at 08/14/18 0800  . traZODone (DESYREL) tablet 50 mg  50 mg Oral QHS PRN Jackelyn PolingBerry, Jason A, NP        Lab Results:  No results found for this or any previous visit (from the past 48 hour(s)).  Blood Alcohol level:  Lab Results  Component Value Date   ETH 232 (H) 08/10/2018    Metabolic Disorder Labs: Lab Results  Component Value Date   HGBA1C 4.9 08/12/2018   MPG 93.93 08/12/2018   No results found for: PROLACTIN Lab Results  Component Value Date   CHOL 184 08/12/2018   TRIG 124 08/12/2018   HDL 46 08/12/2018   CHOLHDL 4.0 08/12/2018   VLDL 25 08/12/2018   LDLCALC 113 (H) 08/12/2018    Physical Findings: AIMS: Facial and Oral Movements Muscles of Facial Expression: None, normal Lips and Perioral Area: None, normal Jaw: None, normal Tongue: None, normal,Extremity Movements Upper (arms, wrists, hands, fingers): None, normal Lower (legs, knees, ankles, toes): None, normal, Trunk Movements Neck, shoulders, hips: None, normal, Overall Severity Severity of abnormal movements (highest score from questions above): None, normal Incapacitation due to abnormal movements: None, normal Patient's awareness of abnormal movements (rate only patient's report): No Awareness, Dental Status Current problems with teeth and/or dentures?: No Does patient usually wear dentures?: No  CIWA:  CIWA-Ar Total: 2 COWS:     Musculoskeletal: Strength & Muscle Tone: within normal limits Gait & Station: normal Patient leans: N/A  Psychiatric Specialty Exam: Physical Exam  Review of Systems  Psychiatric/Behavioral: Positive for depression. The patient is nervous/anxious.   All other systems reviewed and are negative.   Blood pressure (!) 144/100, pulse 83, temperature 98.8 F (37.1 C), temperature source  Oral, resp. rate 16, height 5\' 10"  (1.778 m), weight 79.4 kg, SpO2 99 %.Body mass index is 25.11 kg/m.  General Appearance: Casual  Eye Contact:  Good  Speech:  Clear and Coherent  Volume:  Normal  Mood:  Anxious and Depressed  Affect:  Congruent and Non-Congruent  Thought Process:  Coherent  Orientation:  Full (Time, Place, and Person)  Thought Content:  Hallucinations: None  Suicidal Thoughts:  No  Homicidal Thoughts:  No  Memory:  Immediate;   Fair Recent;  Fair  Judgement:  Fair  Insight:  Fair  Psychomotor Activity:  Normal  Concentration:  Concentration: Fair  Recall:  [employer]  Fund of Knowledge:  Fair  Language:  Fair  Akathisia:  No  Handed:  Right  AIMS (if indicated):     Assets:  Communication Skills Desire for Improvement Resilience Social Support  ADL's:  Intact  Cognition:  WNL  Sleep:  Number of Hours: 5.25     Treatment Plan Summary: Daily contact with patient to assess and evaluate symptoms and progress in treatment and Medication management   Continue with current treatment plan on 08/14/2018 as listed below except where noted  Major Depression:  Continue Trazodone 50 mg PRN QHS Continue Librium 25 mg for CIWA  Withdrawal  CSW to continue working on discharge disposition Patient encouraged to participated  within the therapeutic milieu     Oneta Rackanika N Lewis, NP 08/14/2018, 10:14 AM

## 2018-08-14 NOTE — Progress Notes (Signed)
D. Pt visible in the dayroom interacting well with peers- is calm and cooperative, friendly upon approach. Per pt's self inventory, pt rated his depression, hopelessness and anxiety a 0/0/2, respectively. Pt wrote that his goal today is "going home" and writes that he will "follow doctor/nurse instructions and find positive in all situations". Pt currently denies SI/HI and AVH A. Labs and vitals monitored. Pt compliant with medications. Pt supported emotionally and encouraged to express concerns and ask questions.   R. Pt remains safe with 15 minute checks. Will continue POC.

## 2018-08-14 NOTE — Progress Notes (Signed)
D: Pt was in dayroom upon initial approach.  Pt presents with appropriate affect and mood.  He describes his day as "just fine."  He states "I hope to leave tomorrow."  Pt reports feeling safe to discharge.  Pt denies SI/HI, denies hallucinations, denies pain.  Pt has been visible in milieu interacting with peers and staff appropriately.  Pt seen smiling and laughing frequently tonight.  A: Met with pt 1:1.  Actively listened to pt and offered support and encouragement. 15 minute safety checks in place.  R: Pt is safe on the unit.  Pt verbally contracts for safety.  Will continue to monitor and assess.

## 2018-08-15 DIAGNOSIS — F1024 Alcohol dependence with alcohol-induced mood disorder: Secondary | ICD-10-CM

## 2018-08-15 NOTE — Progress Notes (Signed)
[facility]  08/15/2018 12:41 PM Michael BeaverMatthew Nixon  MRN:  [id number] Subjective: Patient is a 33 year old male with a past psychiatric history significant for alcohol dependence as well as substance-induced mood disorder who was admitted on 08/12/2018 after expressing suicidal and homicidal ideation.  Objective: Patient is seen and examined.  Patient is a 33 year old male with the above-stated past psychiatric history who is seen in follow-up.  He denies complaint today.  Review of the electronic medical record revealed the information from social work.  Patient was aware of some of these things, and stated that he was hoping to be able to be discharged today.  He was aware the child protective services was going to come and interview him today.  I told him that although he was not having any suicidal or homicidal ideation, and he was not having any issues with regard to alcohol withdrawal we would need confirmation of safety and a plan in place by child protective services.  Previously (and the patient's wife prefers that he not know this) the wife is worried about his discharge, and stated that he had been expressing suicidal ideation for some time.  He had also been drinking excessively for more time than he had admitted.  She did state most recently that all the weapons have been removed from the home.  He denied any side effects to his current medications.  His blood pressure still remains mildly elevated at 144/100, he is afebrile and not tachycardic.  He only slept 4.75 hours last night.  Principal Problem: Severe recurrent major depression without psychotic features ([facility]) Diagnosis: Principal Problem:   Severe recurrent major depression without psychotic features ([facility])  Total Time spent with patient: 15 minutes  Past Psychiatric History: See admission H&P  Past Medical History: History reviewed. No pertinent past medical history.  Past Surgical History:  Procedure Laterality Date  . EYE  MUSCLE SURGERY     Family History: History reviewed. No pertinent family history. Family Psychiatric  History: See admission H&P Social History:  Social History   Substance and Sexual Activity  Alcohol Use Yes  . Alcohol/week: 4.0 - 5.0 standard drinks  . Types: 4 - 5 Cans of beer per week     Social History   Substance and Sexual Activity  Drug Use Never    Social History   Socioeconomic History  . Marital status: Married    Spouse name: Not on file  . Number of children: Not on file  . Years of education: Not on file  . Highest education level: Not on file  Occupational History  . Not on file  Social Needs  . Financial resource strain: Not on file  . Food insecurity    Worry: Not on file    Inability: Not on file  . Transportation needs    Medical: Not on file    Non-medical: Not on file  Tobacco Use  . Smoking status: Never Smoker  . Smokeless tobacco: Never Used  Substance and Sexual Activity  . Alcohol use: Yes    Alcohol/week: 4.0 - 5.0 standard drinks    Types: 4 - 5 Cans of beer per week  . Drug use: Never  . Sexual activity: Not Currently  Lifestyle  . Physical activity    Days per week: Not on file    Minutes per session: Not on file  . Stress: Not on file  Relationships  . Social connections    Talks on phone: Not on file  Gets together: Not on file    Attends religious service: Not on file    Active member of club or organization: Not on file    Attends meetings of clubs or organizations: Not on file    Relationship status: Not on file  Other Topics Concern  . Not on file  Social History Narrative  . Not on file   Additional Social History:                         Sleep: Fair  Appetite:  Fair  Current Medications: Current Facility-Administered Medications  Medication Dose Route Frequency Provider Last Rate Last Dose  . acetaminophen (TYLENOL) tablet 650 mg  650 mg Oral Q6H PRN Lindon Romp A, NP      . alum & mag  hydroxide-simeth (MAALOX/MYLANTA) 200-200-20 MG/5ML suspension 30 mL  30 mL Oral Q4H PRN Lindon Romp A, NP      . folic acid (FOLVITE) tablet 1 mg  1 mg Oral Daily Sharma Covert, MD   1 mg at 08/15/18 [id number]  . hydrOXYzine (ATARAX/VISTARIL) tablet 25 mg  25 mg Oral TID PRN Rozetta Nunnery, NP      . magnesium hydroxide (MILK OF MAGNESIA) suspension 30 mL  30 mL Oral Daily PRN Lindon Romp A, NP      . multivitamin with minerals tablet 1 tablet  1 tablet Oral Daily Lindon Romp A, NP   1 tablet at 08/15/18 [phone]  . thiamine (VITAMIN B-1) tablet 100 mg  100 mg Oral Daily Lindon Romp A, NP   100 mg at 08/15/18 0814  . traZODone (DESYREL) tablet 50 mg  50 mg Oral QHS PRN Rozetta Nunnery, NP        Lab Results: No results found for this or any previous visit (from the past 48 hour(s)).  Blood Alcohol level:  Lab Results  Component Value Date   ETH 232 (H) 97/67/3419    Metabolic Disorder Labs: Lab Results  Component Value Date   HGBA1C 4.9 08/12/2018   MPG 93.93 08/12/2018   No results found for: PROLACTIN Lab Results  Component Value Date   CHOL 184 08/12/2018   TRIG 124 08/12/2018   HDL 46 08/12/2018   CHOLHDL 4.0 08/12/2018   VLDL 25 08/12/2018   LDLCALC 113 (H) 08/12/2018    Physical Findings: AIMS: Facial and Oral Movements Muscles of Facial Expression: None, normal Lips and Perioral Area: None, normal Jaw: None, normal Tongue: None, normal,Extremity Movements Upper (arms, wrists, hands, fingers): None, normal Lower (legs, knees, ankles, toes): None, normal, Trunk Movements Neck, shoulders, hips: None, normal, Overall Severity Severity of abnormal movements (highest score from questions above): None, normal Incapacitation due to abnormal movements: None, normal Patient's awareness of abnormal movements (rate only patient's report): No Awareness, Dental Status Current problems with teeth and/or dentures?: No Does patient usually wear dentures?: No  CIWA:  CIWA-Ar  Total: 2 COWS:     Musculoskeletal: Strength & Muscle Tone: within normal limits Gait & Station: normal Patient leans: N/A  Psychiatric Specialty Exam: Physical Exam  Nursing note and vitals reviewed. Constitutional: He is oriented to person, place, and time. He appears well-developed and well-nourished.  HENT:  Head: Normocephalic and atraumatic.  Respiratory: Effort normal.  Neurological: He is alert and oriented to person, place, and time.    ROS  Blood pressure (!) 144/100, pulse 83, temperature 98.8 F (37.1 C), temperature source Oral, resp. rate 16, height 5\' 10"  (  1.778 m), weight 79.4 kg, SpO2 99 %.Body mass index is 25.11 kg/m.  General Appearance: Casual  Eye Contact:  Good  Speech:  Normal Rate  Volume:  Normal  Mood:  Anxious  Affect:  Congruent  Thought Process:  Coherent and Descriptions of Associations: Intact  Orientation:  Full (Time, Place, and Person)  Thought Content:  Logical  Suicidal Thoughts:  No  Homicidal Thoughts:  No  Memory:  Immediate;   Fair Recent;   Fair Remote;   Fair  Judgement:  Intact  Insight:  Fair  Psychomotor Activity:  Increased  Concentration:  Concentration: Fair and Attention Span: Fair  Recall:  [employer]Fair  Fund of Knowledge:  Fair  Language:  Good  Akathisia:  Negative  Handed:  Right  AIMS (if indicated):     Assets:  Desire for Improvement Resilience  ADL's:  Intact  Cognition:  WNL  Sleep:  Number of Hours: 4.75     Treatment Plan Summary: Daily contact with patient to assess and evaluate symptoms and progress in treatment, Medication management and Plan : Patient is seen and examined.  Patient is a 33 year old male with the above-stated past psychiatric history who is seen in follow-up.   Diagnosis: #1 alcohol dependence, #2 substance-induced mood disorder  Patient is seen in follow-up.  His mood is stable.  His withdrawal symptoms are stable.  Reportedly child protective services will be here today.  We will see  what their recommendations are.  I am going to increase his trazodone to 100 mg p.o. nightly to assist with sleep.  No other changes. 1.  Continue folic acid 1 mg p.o. daily for nutritional supplementation. 2.  Continue hydroxyzine 25 mg p.o. 3 times daily as needed anxiety. 3.  Continue multivitamin 1 tablet p.o. daily for nutritional supplementation. 4.  Continue thiamine 100 mg p.o. daily for nutritional supplementation. 5.  Increase trazodone 200 mg p.o. nightly as needed insomnia. 6.  Disposition planning-in progress.  Antonieta PertGreg Lawson Jarah Pember, MD 08/15/2018, 12:41 PM

## 2018-08-15 NOTE — Discharge Summary (Signed)
Physician Discharge Summary Note  Patient:  Michael Nixon is an 33 y.o., male MRN:  [id number] DOB:  14-May-1985 Patient phone:  [phone] (home)  Patient address:   [address] [place] [address],  Total Time spent with patient: 15 minutes  Date of Admission:  08/11/2018 Date of Discharge: 08/15/2018  Reason for Admission:  Alcohol abuse with suicidal statements while intoxicated  Principal Problem: Severe recurrent major depression without psychotic features [facility]) Discharge Diagnoses: Principal Problem:   Severe recurrent major depression without psychotic features ([place]) Active Problems:   Alcohol dependence with alcohol-induced mood disorder ([place])   Past Psychiatric History: He reports history of daily alcohol use that has gradually increased over the years. Denies prior detox or rehab. Denies history of suicide attempts or hospitalizations. Denies psychotropic medications.  Past Medical History: History reviewed. No pertinent past medical history.  Past Surgical History:  Procedure Laterality Date  . EYE MUSCLE SURGERY     Family History: History reviewed. No pertinent family history. Family Psychiatric  History: Father and uncle with alcoholism. Social History:  Social History   Substance and Sexual Activity  Alcohol Use Yes  . Alcohol/week: 4.0 - 5.0 standard drinks  . Types: 4 - 5 Cans of beer per week     Social History   Substance and Sexual Activity  Drug Use Never    Social History   Socioeconomic History  . Marital status: Married    Spouse name: Not on file  . Number of children: Not on file  . Years of education: Not on file  . Highest education level: Not on file  Occupational History  . Not on file  Social Needs  . Financial resource strain: Not on file  . Food insecurity    Worry: Not on file    Inability: Not on file  . Transportation needs    Medical: Not on file    Non-medical: Not on file  Tobacco Use  . Smoking status: Never  Smoker  . Smokeless tobacco: Never Used  Substance and Sexual Activity  . Alcohol use: Yes    Alcohol/week: 4.0 - 5.0 standard drinks    Types: 4 - 5 Cans of beer per week  . Drug use: Never  . Sexual activity: Not Currently  Lifestyle  . Physical activity    Days per week: Not on file    Minutes per session: Not on file  . Stress: Not on file  Relationships  . Social Herbalist on phone: Not on file    Gets together: Not on file    Attends religious service: Not on file    Active member of club or organization: Not on file    Attends meetings of clubs or organizations: Not on file    Relationship status: Not on file  Other Topics Concern  . Not on file  Social History Narrative  . Not on file    Hospital Course:  From admission SRA: Patient is a 33 year old male with a probable past psychiatric history significant for alcohol dependence who presented to the [facility] on 08/11/2018 secondary to concern for suicidal thoughts. Patient stated he had recently had a vasectomy. He stated that it is been complicated with more pain than he had anticipated. He stated that he has been drinking excessively for quite a while, but that his alcohol intake had increased secondary to discomfort from the vasectomy. He stated he and his wife had gotten into  arguments over this. He stated he was upset over the fact that this was so painful for him. The patient stated that his wife told him that he had threatened to kill himself. He does not recall that. He stated that he had been drinking several drinks that day. He stated that he was unsure how much she had drank, but his blood alcohol in the emergency room was 235. The patient stated that he had not harmed himself in the past or thought about suicide. The collateral information from the emergency room revealed that the patient had voiced suicidal ideation in the past in front of their 35 year old daughter.  Reportedly there are multiple guns in the home. She also reported that the patient make suicidal statements when he was intoxicated. Apparently on the evening prior to admission he had been aggressive towards her. She also reported that she is not comfortable with him returning home secondary to his drinking and the weapons in the home. He denied all of this. He denied any helplessness, hopelessness or worthlessness. He denied any previous alcohol withdrawal symptoms. He denied any previous seizures. He denied any hallucinations from alcohol withdrawal. His family has a strong family history of alcoholism. He was admitted to the hospital for evaluation and stabilization.  Michael Nixon was admitted for reported suicidal statements and aggression while intoxicated. [facility]. Admission BAL 232. He was started on CIWA protocol with Librium PRN CIWA>10. He participated in group therapy on the unit. He responded well to treatment with no adverse effects reported. He has shown stable mood, affect, sleep, and interaction. He reports intent to stop drinking after discharge and follow up with couples counseling with his wife. There is an open case with CPS. Collateral information was obtained from his wife, who confirmed that children were out of the home before patient's discharge and children were staying at the wife's parents' home. He is discharging on the medications listed below. Patient denies any SI/HI/AVH and contracts for safety. [facility] He is provided with prescriptions for medications upon discharge. His wife is picking him up for discharge home.  Physical Findings: AIMS: Facial and Oral Movements Muscles of Facial Expression: None, normal Lips and Perioral Area: None, normal Jaw: None, normal Tongue: None, normal,Extremity Movements Upper (arms, wrists, hands, fingers): None, normal Lower (legs, knees, ankles, toes): None,  normal, Trunk Movements Neck, shoulders, hips: None, normal, Overall Severity Severity of abnormal movements (highest score from questions above): None, normal Incapacitation due to abnormal movements: None, normal Patient's awareness of abnormal movements (rate only patient's report): No Awareness, Dental Status Current problems with teeth and/or dentures?: No Does patient usually wear dentures?: No  CIWA:  CIWA-Ar Total: 2 COWS:     Musculoskeletal: Strength & Muscle Tone: within normal limits Gait & Station: normal Patient leans: N/A  Psychiatric Specialty Exam: Physical Exam  Nursing note and vitals reviewed. Constitutional: He is oriented to person, place, and time. He appears well-developed and well-nourished.  Cardiovascular: Normal rate.  Respiratory: Effort normal.  Neurological: He is alert and oriented to person, place, and time.    Review of Systems  Constitutional: Negative.   Psychiatric/Behavioral: Positive for substance abuse. Negative for depression, hallucinations and suicidal ideas. The patient is not nervous/anxious and does not have insomnia.     Blood pressure (!) 144/100, pulse 83, temperature 98.8 F (37.1 C), temperature source Oral, resp. rate 16, height 5\' 10"  (1.778 m),  weight 79.4 kg, SpO2 99 %.Body mass index is 25.11 kg/m.  See MD's discharge SRA        Has this patient used any form of tobacco in the last 30 days? (Cigarettes, Smokeless Tobacco, Cigars, and/or Pipes)  No  Blood Alcohol level:  Lab Results  Component Value Date   ETH 232 (H) 08/10/2018    Metabolic Disorder Labs:  Lab Results  Component Value Date   HGBA1C 4.9 08/12/2018   MPG 93.93 08/12/2018   No results found for: PROLACTIN Lab Results  Component Value Date   CHOL 184 08/12/2018   TRIG 124 08/12/2018   HDL 46 08/12/2018   CHOLHDL 4.0 08/12/2018   VLDL 25 08/12/2018   LDLCALC 113 (H) 08/12/2018    See Psychiatric Specialty Exam and Suicide Risk Assessment  completed by Attending Physician prior to discharge.  Discharge destination:  Home  Is patient on multiple antipsychotic therapies at discharge:  No   Has Patient had three or more failed trials of antipsychotic monotherapy by history:  No  Recommended Plan for Multiple Antipsychotic Therapies: NA  Discharge Instructions    Discharge instructions   Complete by: As directed    Patient is instructed to take all prescribed medications as recommended. Report any side effects or adverse reactions to your outpatient psychiatrist. Patient is instructed to abstain from alcohol and illegal drugs while on prescription medications. In the event of worsening symptoms, patient is instructed to call the crisis hotline, 911, or go to the nearest emergency department for evaluation and treatment.     Allergies as of 08/15/2018      Reactions   Aspirin Hives   Bee Venom       Medication List    STOP taking these medications   azelastine 0.1 % nasal spray Commonly known as: ASTELIN   cetirizine 10 MG tablet Commonly known as: ZYRTEC   pantoprazole 20 MG tablet Commonly known as: Protonix      Follow-up Information    [facility] Follow up on 08/17/2018.   Why: Hospital discharge appointment is Wednesday, 7/8 at 2:30p.  Please bring your photo ID, insurance card, current medications and discharge paperwork from this hospitalization.  Contact information: [address] [place] [address] [phone]           Follow-up recommendations: Activity as tolerated. Diet as recommended by primary care physician. Keep all scheduled follow-up appointments as recommended.   Comments:   Patient is instructed to take all prescribed medications as recommended. Report any side effects or adverse reactions to your outpatient psychiatrist. Patient is instructed to abstain from alcohol and illegal drugs while on prescription medications. In the event of worsening symptoms,  patient is instructed to call the crisis hotline, 911, or go to the nearest emergency department for evaluation and treatment.  Signed: Aldean BakerJanet E Pasha Broad, NP 08/16/2018, 9:05 AM

## 2018-08-15 NOTE — Plan of Care (Signed)
  Problem: Education: Goal: Knowledge of [facility] Education information/materials will improve Outcome: Progressing Goal: Emotional status will improve Outcome: Progressing Goal: Mental status will improve Outcome: Progressing Goal: Verbalization of understanding the information provided will improve Outcome: Progressing   Problem: Activity: Goal: Interest or engagement in activities will improve Outcome: Progressing   

## 2018-08-15 NOTE — [facility]HH Suicide Risk Assessment (Signed)
[facility] Discharge Suicide Risk Assessment   Principal Problem: Severe recurrent major depression without psychotic features [facility]) Discharge Diagnoses: Principal Problem:   Severe recurrent major depression without psychotic features ([place]) Active Problems:   Alcohol dependence with alcohol-induced mood disorder ([place])   Total Time spent with patient: 30 minutes  Musculoskeletal: Strength & Muscle Tone: within normal limits Gait & Station: normal Patient leans: N/A  Psychiatric Specialty Exam: Review of Systems  All other systems reviewed and are negative.   Blood pressure (!) 144/100, pulse 83, temperature 98.8 F (37.1 C), temperature source Oral, resp. rate 16, height 5\' 10"  (1.778 m), weight 79.4 kg, SpO2 99 %.Body mass index is 25.11 kg/m.  General Appearance: Casual  Eye Contact::  Good  Speech:  Normal Rate409  Volume:  Normal  Mood:  Anxious  Affect:  Congruent  Thought Process:  Coherent and Descriptions of Associations: Intact  Orientation:  Full (Time, Place, and Person)  Thought Content:  Logical  Suicidal Thoughts:  No  Homicidal Thoughts:  No  Memory:  Immediate;   Fair Recent;   Fair Remote;   Fair  Judgement:  Fair  Insight:  Fair  Psychomotor Activity:  Normal  Concentration:  Fair  Recall:  [employer] of Knowledge:Fair  Language: Fair  Akathisia:  Negative  Handed:  Right  AIMS (if indicated):     Assets:  Desire for Improvement Resilience  Sleep:  Number of Hours: 4.75  Cognition: WNL  ADL's:  Intact   Mental Status Per Nursing Assessment::   On Admission:  Suicidal ideation indicated by patient, Suicidal ideation indicated by others, Self-harm thoughts  Demographic Factors:  Male and Caucasian  Loss Factors: Financial problems/change in socioeconomic status  Historical Factors: Impulsivity  Risk Reduction Factors:   Responsible for children under 43 years of age, Sense of responsibility to family, Employed and Living with another person,  especially a relative  Continued Clinical Symptoms:  Alcohol/Substance Abuse/Dependencies  Cognitive Features That Contribute To Risk:  None    Suicide Risk:  Minimal: No identifiable suicidal ideation.  Patients presenting with no risk factors but with morbid ruminations; may be classified as minimal risk based on the severity of the depressive symptoms    Plan Of Care/Follow-up recommendations:  Activity:  ad lib  Sharma Covert, MD 08/15/2018, 3:49 PM

## 2018-08-15 NOTE — Progress Notes (Signed)
  [facility] Adult Case Management Discharge Plan :  Will you be returning to the same living situation after discharge:  Yes,  with wife At discharge, do you have transportation home?: Yes,  with wife Do you have the ability to pay for your medications: Yes,  private insurance  Release of information consent forms completed and in the chart;  Patient's signature needed at discharge.  Patient to Follow up at: Follow-up Information    [facility] Follow up on 08/17/2018.   Why: Hospital discharge appointment is Wednesday, 7/8 at 2:30p.  Please bring your photo ID, insurance card, current medications and discharge paperwork from this hospitalization.  Contact information: [place] [address] [phone]           Next level of care provider has access to [place] and Suicide Prevention discussed: Yes,  pt's wife     Has patient been referred to the Quitline?: N/A patient is not a smoker  Patient has been referred for addiction treatment: Yes  Trecia Rogers, LCSW 08/15/2018, 4:10 PM

## 2018-08-17 DIAGNOSIS — R69 Illness, unspecified: Secondary | ICD-10-CM | POA: Diagnosis not present

## 2018-09-01 DIAGNOSIS — R69 Illness, unspecified: Secondary | ICD-10-CM | POA: Diagnosis not present

## 2018-09-08 DIAGNOSIS — R69 Illness, unspecified: Secondary | ICD-10-CM | POA: Diagnosis not present

## 2018-09-13 DIAGNOSIS — R69 Illness, unspecified: Secondary | ICD-10-CM | POA: Diagnosis not present

## 2018-09-29 DIAGNOSIS — R69 Illness, unspecified: Secondary | ICD-10-CM | POA: Diagnosis not present

## 2018-10-06 DIAGNOSIS — R69 Illness, unspecified: Secondary | ICD-10-CM | POA: Diagnosis not present

## 2018-10-07 DIAGNOSIS — Z961 Presence of intraocular lens: Secondary | ICD-10-CM | POA: Diagnosis not present

## 2018-10-07 DIAGNOSIS — H33051 Total retinal detachment, right eye: Secondary | ICD-10-CM | POA: Diagnosis not present

## 2018-10-07 DIAGNOSIS — H43812 Vitreous degeneration, left eye: Secondary | ICD-10-CM | POA: Diagnosis not present

## 2019-05-20 ENCOUNTER — Ambulatory Visit: Payer: 59 | Attending: Internal Medicine

## 2019-05-20 DIAGNOSIS — Z23 Encounter for immunization: Secondary | ICD-10-CM

## 2019-05-20 NOTE — Progress Notes (Signed)
   Covid-19 Vaccination Clinic  Name:  Michael Nixon    MRN: [id number] DOB: April 25, 1985  05/20/2019  [name] was observed post Covid-19 immunization for 15 minutes without incident. He was provided with Vaccine Information Sheet and instruction to access the V-Safe system. Patient was observed in observation, he did not check out due to wife having complaints and leaving facility via EMS, he was with his wife.  [name] was instructed to call 911 with any severe reactions post vaccine: Marland Kitchen Difficulty breathing  . Swelling of face and throat  . A fast heartbeat  . A bad rash all over body  . Dizziness and weakness   Immunizations Administered    Name Date Dose VIS Date Route   Pfizer COVID-19 Vaccine 05/20/2019  1:03 PM 0.3 mL 01/20/2019 Intramuscular   Manufacturer: [employer], [employer]   Lot: [id number]   NDC: [address]-1

## 2019-06-14 ENCOUNTER — Ambulatory Visit: Payer: 59 | Attending: Internal Medicine

## 2019-06-14 DIAGNOSIS — Z23 Encounter for immunization: Secondary | ICD-10-CM

## 2019-06-14 NOTE — Progress Notes (Signed)
   Covid-19 Vaccination Clinic  Name:  Michael Nixon    MRN: [id number] DOB: 03-Aug-1985  06/14/2019  Michael Nixon was observed post Covid-19 immunization for 15 minutes without incident. He was provided with Vaccine Information Sheet and instruction to access the V-Safe system.   Michael Nixon was instructed to call 911 with any severe reactions post vaccine: Marland Kitchen Difficulty breathing  . Swelling of face and throat  . A fast heartbeat  . A bad rash all over body  . Dizziness and weakness   Immunizations Administered    Name Date Dose VIS Date Route   Pfizer COVID-19 Vaccine 06/14/2019  3:33 PM 0.3 mL 04/05/2018 Intramuscular   Manufacturer: [employer], [employer]   Lot: [id number]   NDC: [address]-3

## 2019-11-05 IMAGING — CR DG CHEST 2V
1 series · 2 of 2 positions shown · non-contrast
Comparison: None.

CLINICAL DATA: Intermittent left-sided chest pain for 1 month.
Constant chest pressure.

EXAM:
CHEST - 2 VIEW

[Series 1: dg chest 2 view · 0.14mm/px · 2 of 2 slices shown]
[im 1/2]
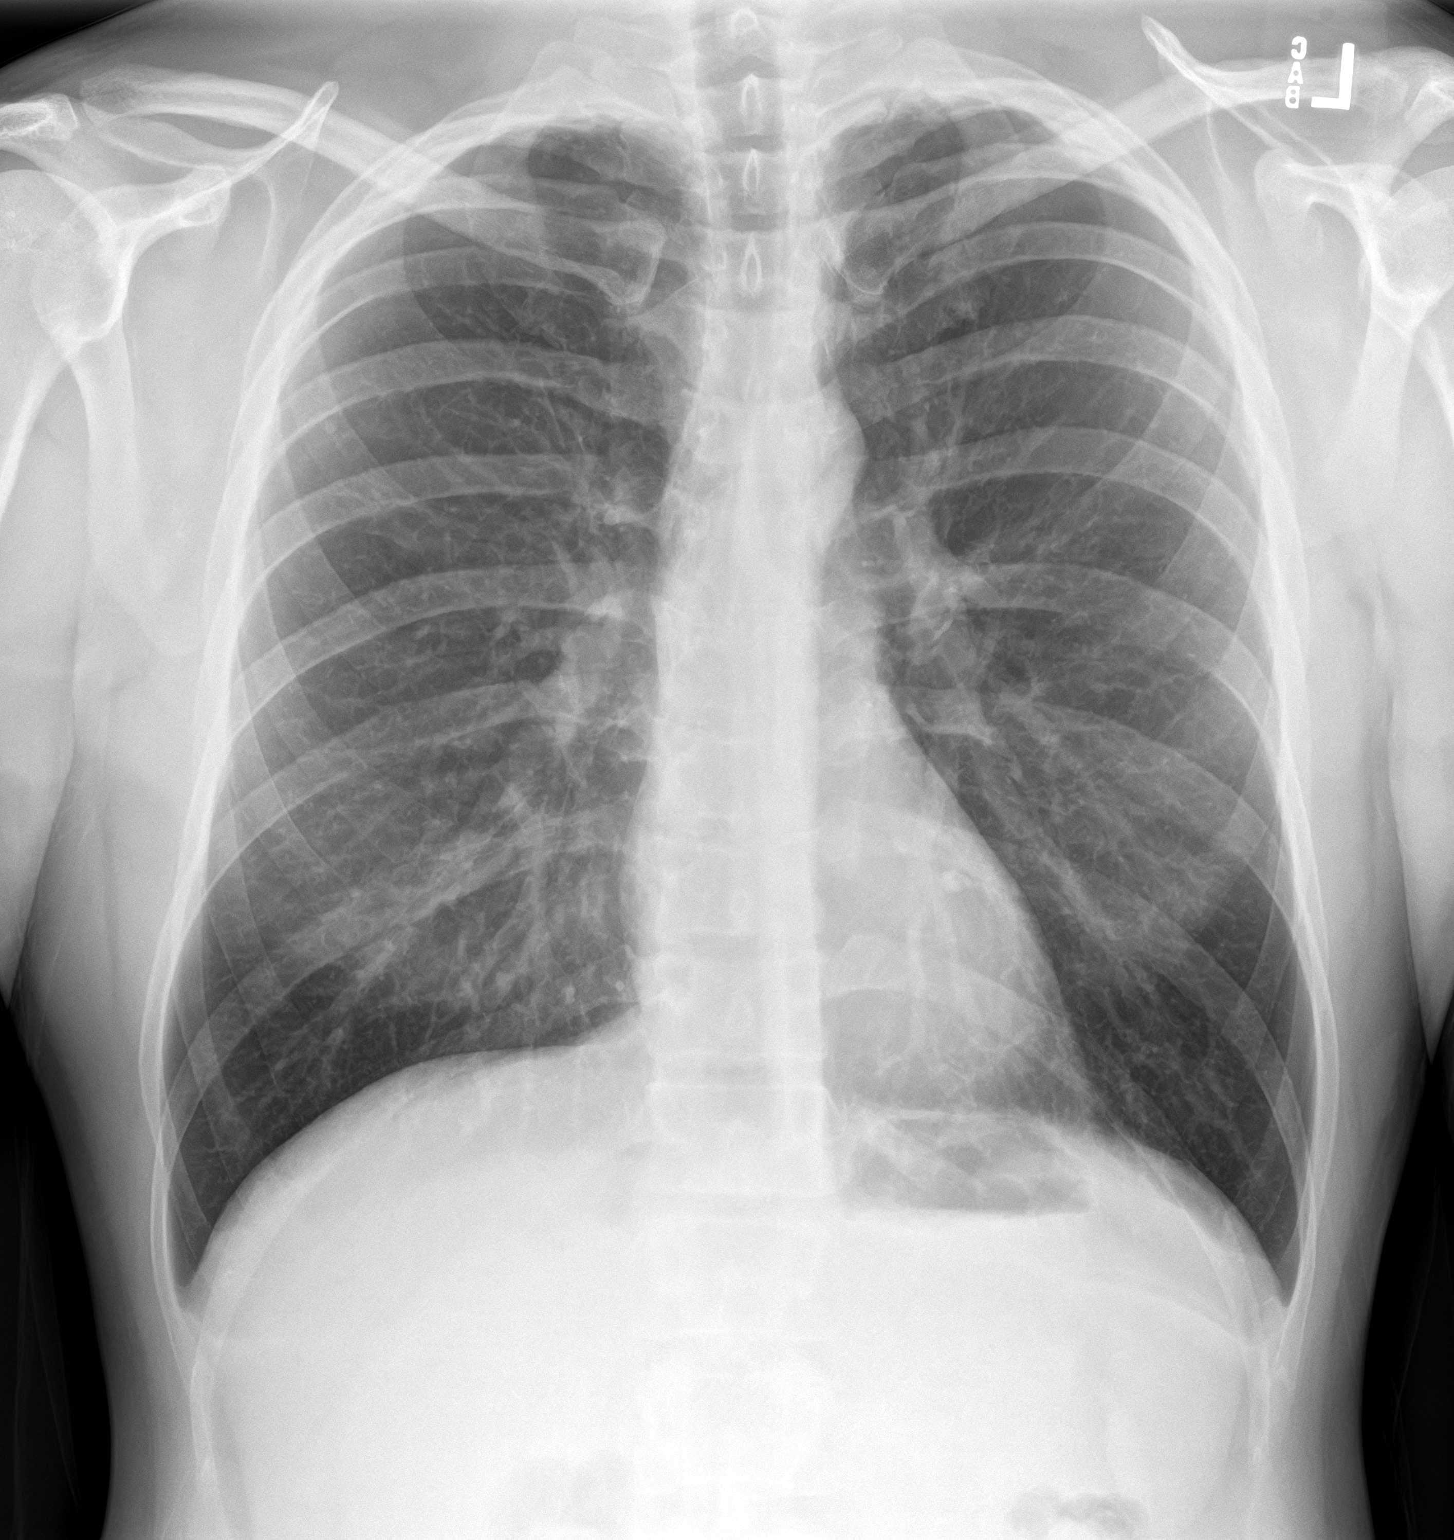
[im 2/2]
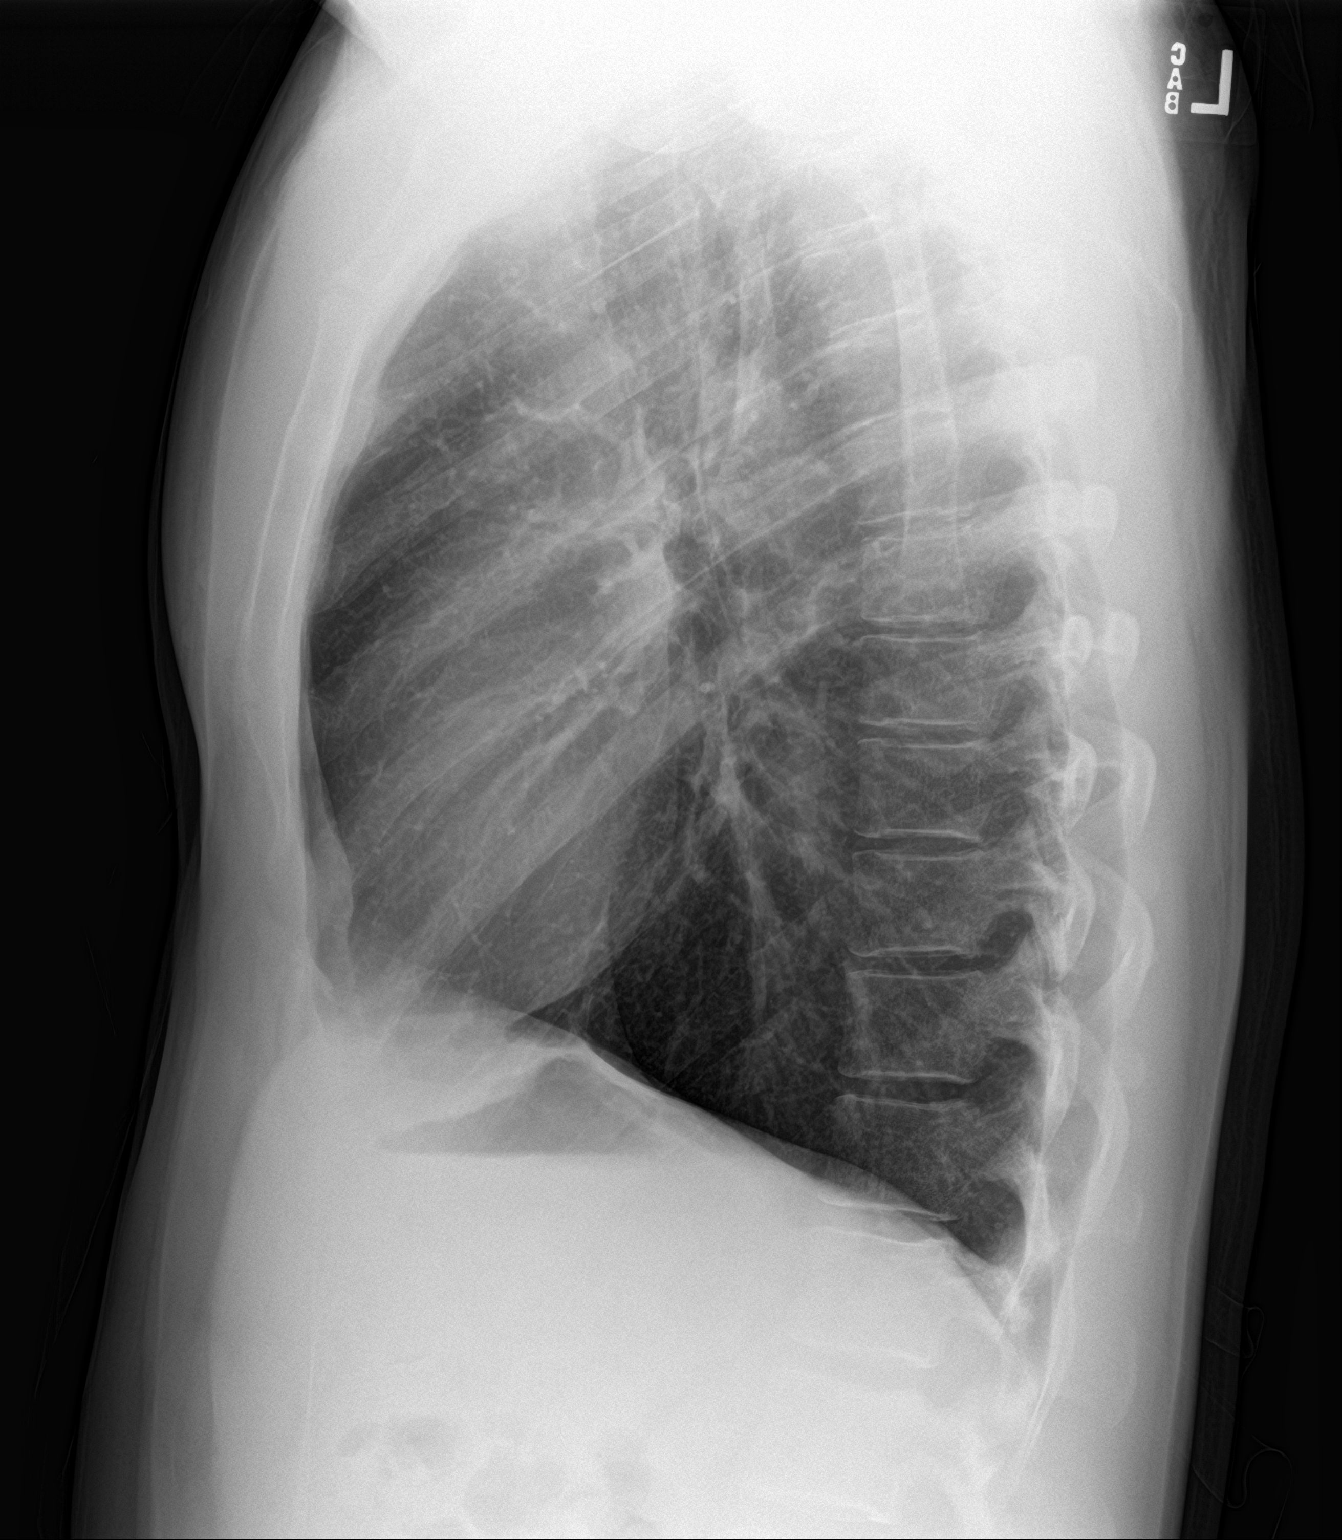

[2 of 2 positions shown; findings below may reference images not displayed]

FINDINGS: A 3 mm nodular density projects in the right upper lobe. This is not
well seen on the lateral view. This likely represents a small
granuloma. Neoplasm would be unlikely in this age group.

Heart size is normal. The lungs are otherwise clear. No significant
edema or effusion is present. No airspace disease is present. The
visualized soft tissues and bony thorax are otherwise unremarkable.
IMPRESSION: 1. No acute cardiopulmonary disease.
2. 3 mm nodular density in the right upper lobe is likely post
infectious or inflammation. Recommend follow-up two-view chest x-ray
at 6-9 months versus CT of the chest with contrast for further
evaluation.

## 2020-03-18 ENCOUNTER — Telehealth: Payer: Self-pay

## 2020-03-18 NOTE — Telephone Encounter (Signed)
Laser treatment alone for keloids tends not to be effective. Laser treatment COMBINED with steroid injections can be effective. There are specific articles about this if he wants a copy. But NO TREATMENT IS GUARANTEED TO WORK And Any treatment used may need several sessions and follow up evaluations. Because Keloids can persist and recur no matter what treatment used and whether initially successful or not.  He may make an appt for discussion of further treatment if he would like.

## 2020-03-18 NOTE — Telephone Encounter (Signed)
This patient called and was asking questions about a laser procedure to help with keloid scaring. Patient had seen you before the ransom ware attack for keloid injections but patient states the scarring just continued to get bigger. Patient has done research and everything has advised that a laser treatment would be the next step. Do you know what type of laser treatment? Something we would do here?

## 2020-03-18 NOTE — Telephone Encounter (Signed)
Will our laser we have here work for this patient?  Also where can I find a copy of this, patient would like a copy emailed to him.

## 2020-03-19 NOTE — Telephone Encounter (Signed)
Patient emailed copied of article from Dr. Gwen Pounds.  Dr. Gwen Pounds states our Halo/Fraxel will work with patient.
